# Patient Record
Sex: Male | Born: 1990
Health system: Southern US, Community
[De-identification: ages and names within clinical notes are randomized; demographics above are authoritative.]

## PROBLEM LIST (undated history)

## (undated) DIAGNOSIS — F429 Obsessive-compulsive disorder, unspecified: Secondary | ICD-10-CM

## (undated) DIAGNOSIS — F419 Anxiety disorder, unspecified: Secondary | ICD-10-CM

## (undated) DIAGNOSIS — F410 Panic disorder [episodic paroxysmal anxiety] without agoraphobia: Secondary | ICD-10-CM

## (undated) DIAGNOSIS — L309 Dermatitis, unspecified: Secondary | ICD-10-CM

## (undated) DIAGNOSIS — F909 Attention-deficit hyperactivity disorder, unspecified type: Secondary | ICD-10-CM

## (undated) DIAGNOSIS — J45909 Unspecified asthma, uncomplicated: Secondary | ICD-10-CM

## (undated) DIAGNOSIS — X838XXA Intentional self-harm by other specified means, initial encounter: Secondary | ICD-10-CM

## (undated) HISTORY — PX: NO PAST SURGERIES: SHX2092

## (undated) HISTORY — DX: Dermatitis, unspecified: L30.9

## (undated) HISTORY — DX: Intentional self-harm by other specified means, initial encounter: X83.8XXA

## (undated) HISTORY — DX: Attention-deficit hyperactivity disorder, unspecified type: F90.9

---
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
## 2012-03-22 ENCOUNTER — Emergency Department (HOSPITAL_BASED_OUTPATIENT_CLINIC_OR_DEPARTMENT_OTHER): Payer: BC Managed Care – PPO

## 2012-03-22 ENCOUNTER — Emergency Department (HOSPITAL_BASED_OUTPATIENT_CLINIC_OR_DEPARTMENT_OTHER)
Admission: EM | Admit: 2012-03-22 | Discharge: 2012-03-22 | Disposition: A | Discharge status: Home / Self Care | Payer: BC Managed Care – PPO | Attending: Emergency Medicine | Admitting: Emergency Medicine

## 2012-03-22 ENCOUNTER — Encounter (HOSPITAL_BASED_OUTPATIENT_CLINIC_OR_DEPARTMENT_OTHER): Payer: Self-pay | Admitting: Family Medicine

## 2012-03-22 DIAGNOSIS — J45909 Unspecified asthma, uncomplicated: Secondary | ICD-10-CM | POA: Insufficient documentation

## 2012-03-22 DIAGNOSIS — X500XXA Overexertion from strenuous movement or load, initial encounter: Secondary | ICD-10-CM | POA: Insufficient documentation

## 2012-03-22 DIAGNOSIS — Y9383 Activity, rough housing and horseplay: Secondary | ICD-10-CM | POA: Insufficient documentation

## 2012-03-22 DIAGNOSIS — S63501A Unspecified sprain of right wrist, initial encounter: Secondary | ICD-10-CM

## 2012-03-22 DIAGNOSIS — S63509A Unspecified sprain of unspecified wrist, initial encounter: Secondary | ICD-10-CM | POA: Insufficient documentation

## 2012-03-22 DIAGNOSIS — F17200 Nicotine dependence, unspecified, uncomplicated: Secondary | ICD-10-CM | POA: Insufficient documentation

## 2012-03-22 DIAGNOSIS — F429 Obsessive-compulsive disorder, unspecified: Secondary | ICD-10-CM | POA: Insufficient documentation

## 2012-03-22 HISTORY — DX: Obsessive-compulsive disorder, unspecified: F42.9

## 2012-03-22 HISTORY — DX: Unspecified asthma, uncomplicated: J45.909

## 2012-03-22 MED ORDER — IBUPROFEN 800 MG PO TABS
800.0000 mg | ORAL_TABLET | Freq: Once | ORAL | Status: AC
  Administered 2012-03-22: 800 mg via ORAL
  Filled 2012-03-22: qty 1

## 2012-03-22 NOTE — ED Provider Notes (Signed)
History     CSN: [id number]  Arrival date & time 03/22/12  1221   First MD Initiated Contact with Patient 03/22/12 1245      Chief Complaint  Patient presents with  . Wrist Pain    (Consider location/radiation/quality/duration/timing/severity/associated sxs/prior treatment) HPI Patient is a 21 year old male who presents today complaining of right wrist pain that is a 6/10 when it is moved or palpated. He reports only 2/10 pain at rest. Patient was joking around and roughhousing with friends last night when he injured his wrist. He is right-hand dominant. He reports that he did not have pain last night but that he noted the pain today while he was working lifting heavy boxes. Patient denies any other injuries. He took ibuprofen this morning at 5 AM and had some improvement. The pain is an aching sensation. There are no other associated or modifying factors. Past Medical History  Diagnosis Date  . Asthma   . OCD (obsessive compulsive disorder)     History reviewed. No pertinent past surgical history.  History reviewed. No pertinent family history.  History  Substance Use Topics  . Smoking status: Current Everyday Smoker  . Smokeless tobacco: Not on file  . Alcohol Use: Yes      Review of Systems  Constitutional: Negative.   HENT: Negative.   Eyes: Negative.   Respiratory: Negative.   Cardiovascular: Negative.   Gastrointestinal: Negative.   Genitourinary: Negative.   Musculoskeletal:       Right wrist pain  Skin: Negative.   Neurological: Negative.   Hematological: Negative.   Psychiatric/Behavioral: Negative.   All other systems reviewed and are negative.    Allergies  Review of patient's allergies indicates no known allergies.  Home Medications   Current Outpatient Rx  Name Route Sig Dispense Refill  . ALBUTEROL SULFATE HFA 108 (90 BASE) MCG/ACT IN AERS Inhalation Inhale 2 puffs into the lungs every 6 (six) hours as needed.    [name] FLUTICASONE-SALMETEROL  100-50 MCG/DOSE IN AEPB Inhalation Inhale 1 puff into the lungs every 12 (twelve) hours.    [name] MONTELUKAST SODIUM 10 MG PO TABS Oral Take 10 mg by mouth at bedtime.    . SERTRALINE HCL 25 MG PO TABS Oral Take 25 mg by mouth daily.       BP 127/78  Pulse 83  Temp 98 F (36.7 C) (Oral)  Resp 16  Ht 6\' 2"  (1.88 m)  Wt 160 lb (72.576 kg)  BMI 20.54 kg/m2  SpO2 99%  Physical Exam  Nursing note and vitals reviewed. GEN: Well-developed, well-nourished male in no distress HEENT: Atraumatic, normocephalic.  EYES: PERRLA BL, no scleral icterus. NECK: Trachea midline, no meningismus CV: regular rate and rhythm.  PULM: No respiratory distress.   Neuro: cranial nerves grossly 2-12 intact, no abnormalities of strength or sensation, A and O x 3 MSK: Patient moves all 4 extremities symmetrically. Mild tenderness to palpation over the dorsum of the right wrist. Patient has no snuffbox tenderness or deformity. He is neurovascularly intact distal to the injury with no deficits of strength. Otherwise within normal limits.  Skin: No rashes petechiae, purpura, or jaundice Psych: no abnormality of mood   ED Course  Procedures (including critical care time)  Labs Reviewed - No data to display Dg Wrist Complete Right  03/22/2012  *RADIOLOGY REPORT*  Clinical Data: Right wrist pain following an injury last night.  RIGHT WRIST - COMPLETE 3+ VIEW  Comparison: None.  Findings: Normal appearing bones and soft tissues  without fracture or dislocation.  IMPRESSION: Normal examination.  Original Report Authenticated By: Darrol Angel, M.D.     1. Sprain of wrist, right       MDM  Patient was evaluated by myself. Based on evaluation patient had plain film of the right wrist. This was negative. I had no concerns for infectious ideology and patient had no bony an etiology for his symptoms. I do think patient likely has right wrist sprain. He was given ibuprofen as well as an ice pack and wrist splint for  support. Patient can followup with Dr. Pearletha Forge if he continues to have difficulty with this. Patient was discharged in good condition.        Cyndra Numbers, MD 03/22/12 1425

## 2012-03-22 NOTE — ED Notes (Signed)
Pt c/o right wrist pain after "wrestling with friends" last night. Cms intact.

## 2012-03-22 NOTE — ED Notes (Signed)
Patient transported to X-ray 

## 2012-03-22 NOTE — Discharge Instructions (Signed)
Joint Sprain A sprain is a tear or stretch in the ligaments that hold a joint together. Severe sprains may need as long as 3-6 weeks of immobilization and/or exercises to heal completely. Sprained joints should be rested and protected. If not, they can become unstable and prone to re-injury. Proper treatment can reduce your pain, shorten the period of disability, and reduce the risk of repeated injuries. TREATMENT   Rest and elevate the injured joint to reduce pain and swelling.   Apply ice packs to the injury for 20-30 minutes every 2-3 hours for the next 2-3 days.   Keep the injury wrapped in a compression bandage or splint as long as the joint is painful or as instructed by your caregiver.   Do not use the injured joint until it is completely healed to prevent re-injury and chronic instability. Follow the instructions of your caregiver.   Long-term sprain management may require exercises and/or treatment by a physical therapist. Taping or special braces may help stabilize the joint until it is completely better.  SEEK MEDICAL CARE IF:   You develop increased pain or swelling of the joint.   You develop increasing redness and warmth of the joint.   You develop a fever.   It becomes stiff.   Your hand or foot gets cold or numb.  Document Released: 10/30/2004 Document Revised: 09/11/2011 Document Reviewed: 10/09/2008 [employer] Patient Information 2012 [employer]. 

## 2015-10-03 ENCOUNTER — Other Ambulatory Visit: Payer: Self-pay | Admitting: *Deleted

## 2015-10-03 DIAGNOSIS — J4520 Mild intermittent asthma, uncomplicated: Secondary | ICD-10-CM

## 2015-10-04 ENCOUNTER — Ambulatory Visit (INDEPENDENT_AMBULATORY_CARE_PROVIDER_SITE_OTHER): Payer: BLUE CROSS/BLUE SHIELD | Admitting: Internal Medicine

## 2015-10-04 DIAGNOSIS — J4520 Mild intermittent asthma, uncomplicated: Secondary | ICD-10-CM

## 2015-10-04 LAB — PULMONARY FUNCTION TEST
DL/VA % pred: 111 %
DL/VA: 5.43 ml/min/mmHg/L
DLCO unc % pred: 101 %
DLCO unc: 37.01 ml/min/mmHg
FEF 25-75 Post: 3.36 L/sec
FEF 25-75 Pre: 2.71 L/sec
FEF2575-%Change-Post: 23 %
FEF2575-%Pred-Post: 65 %
FEF2575-%Pred-Pre: 53 %
FEV1-%Change-Post: 5 %
FEV1-%Pred-Post: 79 %
FEV1-%Pred-Pre: 75 %
FEV1-Post: 3.99 L
FEV1-Pre: 3.78 L
FEV1FVC-%Change-Post: 5 %
FEV1FVC-%Pred-Pre: 83 %
FEV6-%Change-Post: 0 %
FEV6-%Pred-Post: 89 %
FEV6-%Pred-Pre: 89 %
FEV6-Post: 5.43 L
FEV6-Pre: 5.39 L
FEV6FVC-%Change-Post: 0 %
FEV6FVC-%Pred-Post: 100 %
FEV6FVC-%Pred-Pre: 100 %
FVC-%Change-Post: 0 %
FVC-%Pred-Post: 89 %
FVC-%Pred-Pre: 89 %
FVC-Post: 5.44 L
FVC-Pre: 5.45 L
Post FEV1/FVC ratio: 73 %
Post FEV6/FVC ratio: 100 %
Pre FEV1/FVC ratio: 69 %
Pre FEV6/FVC Ratio: 100 %
RV % pred: 129 %
RV: 2.13 L
TLC % pred: 99 %
TLC: 7.46 L

## 2015-10-04 NOTE — Progress Notes (Signed)
PFT performed today. 

## 2016-04-30 ENCOUNTER — Emergency Department (HOSPITAL_BASED_OUTPATIENT_CLINIC_OR_DEPARTMENT_OTHER): Payer: BLUE CROSS/BLUE SHIELD

## 2016-04-30 ENCOUNTER — Emergency Department (HOSPITAL_BASED_OUTPATIENT_CLINIC_OR_DEPARTMENT_OTHER)
Admission: EM | Admit: 2016-04-30 | Discharge: 2016-05-01 | Disposition: A | Discharge status: Home / Self Care | Payer: BLUE CROSS/BLUE SHIELD | Attending: Emergency Medicine | Admitting: Emergency Medicine

## 2016-04-30 ENCOUNTER — Encounter (HOSPITAL_BASED_OUTPATIENT_CLINIC_OR_DEPARTMENT_OTHER): Payer: Self-pay | Admitting: *Deleted

## 2016-04-30 DIAGNOSIS — Z87891 Personal history of nicotine dependence: Secondary | ICD-10-CM | POA: Diagnosis not present

## 2016-04-30 DIAGNOSIS — J45901 Unspecified asthma with (acute) exacerbation: Secondary | ICD-10-CM | POA: Insufficient documentation

## 2016-04-30 DIAGNOSIS — J45909 Unspecified asthma, uncomplicated: Secondary | ICD-10-CM | POA: Diagnosis present

## 2016-04-30 DIAGNOSIS — F419 Anxiety disorder, unspecified: Secondary | ICD-10-CM | POA: Diagnosis not present

## 2016-04-30 MED ORDER — PREDNISONE 50 MG PO TABS
50.0000 mg | ORAL_TABLET | Freq: Once | ORAL | Status: AC
  Administered 2016-04-30: 50 mg via ORAL
  Filled 2016-04-30: qty 1

## 2016-04-30 MED ORDER — IPRATROPIUM-ALBUTEROL 0.5-2.5 (3) MG/3ML IN SOLN
3.0000 mL | Freq: Four times a day (QID) | RESPIRATORY_TRACT | Status: DC
  Administered 2016-04-30: 3 mL via RESPIRATORY_TRACT
  Filled 2016-04-30: qty 3

## 2016-04-30 MED ORDER — ALBUTEROL SULFATE (2.5 MG/3ML) 0.083% IN NEBU
5.0000 mg | INHALATION_SOLUTION | RESPIRATORY_TRACT | Status: AC
  Administered 2016-04-30: 5 mg via RESPIRATORY_TRACT
  Filled 2016-04-30: qty 6

## 2016-04-30 MED ORDER — LORAZEPAM 1 MG PO TABS
1.0000 mg | ORAL_TABLET | Freq: Once | ORAL | Status: AC
  Administered 2016-04-30: 1 mg via ORAL
  Filled 2016-04-30: qty 1

## 2016-04-30 NOTE — ED Triage Notes (Signed)
Pt c/o "asthma attack" no relief with albuterol inhaler x 2 days

## 2016-04-30 NOTE — ED Provider Notes (Signed)
[facility] Note   CSN: [id number] Arrival date & time: 04/30/16  2233  First Provider Contact:  None    By signing my name below, I, Arianna Nassar, attest that this documentation has been prepared under the direction and in the presence of Chamara Dyck, PA-C.  Electronically Signed: Octavia Heir, ED Scribe. 04/30/16. 11:09 PM.    History   Chief Complaint Chief Complaint  Patient presents with  . Asthma    The history is provided by the patient. No language interpreter was used.   HPI Comments: Isaiah Stevens is a 25 y.o. male who has a PMHx of asthma and OCD presents to the Emergency Department complaining of a sudden onset, gradual worsening, moderate asthma attack that started two days ago but became worse about two hours ago. He reports associated wheezing, chest tightness, and allergy symptoms that include rhinorrhea. Pt says he was very active today and was outside most of the day. He exercised and ran without respiratory issue. Pt says the air conditioner in his house is broken and it is very humid. He believes this triggered his asthma attack earlier today. Pt has been using his inhaler to alleviate his symptoms with no relief. Pt states that he believes he is also experiencing a panic attack due to sensation of not being able to breath. Pt reports history of anxiety and panic. He was previously tried on SSRIs but states that he took himself off anxiety medicines because he was able to control his symptoms with exercise. He denies any other complaints today.   Past Medical History:  Diagnosis Date  . Asthma   . OCD (obsessive compulsive disorder)     Patient Active Problem List   Diagnosis Date Noted  . ADHD 09/06/2009  . ALLERGIC RHINITIS 09/06/2009  . ASTHMA 09/06/2009    History reviewed. No pertinent surgical history.     Home Medications    Prior to Admission medications   Medication Sig Start Date End Date Taking? Authorizing  Provider  albuterol (PROVENTIL HFA;VENTOLIN HFA) 108 (90 BASE) MCG/ACT inhaler Inhale 2 puffs into the lungs every 6 (six) hours as needed.    Historical Provider, MD  Fluticasone-Salmeterol (ADVAIR) 100-50 MCG/DOSE AEPB Inhale 1 puff into the lungs every 12 (twelve) hours.    Historical Provider, MD  montelukast (SINGULAIR) 10 MG tablet Take 10 mg by mouth at bedtime.    Historical Provider, MD  predniSONE (DELTASONE) 50 MG tablet Take one tablet once a day for 5 days 05/01/16   Rolm Gala Trenia Tennyson, PA-C  sertraline (ZOLOFT) 25 MG tablet Take 25 mg by mouth daily.     Historical Provider, MD    Family History History reviewed. No pertinent family history.  Social History Social History  Substance Use Topics  . Smoking status: Former Games developer  . Smokeless tobacco: Not on file  . Alcohol use Yes     Allergies   Review of patient's allergies indicates no known allergies.   Review of Systems Review of Systems  HENT: Positive for rhinorrhea.   Respiratory: Positive for wheezing.   Cardiovascular: Positive for chest pain (chest tightness).  All other systems reviewed and are negative.    Physical Exam Updated Vital Signs BP 129/84 (BP Location: Left Arm)   Pulse 84   Temp 98.1 F (36.7 C) (Oral)   Resp 16   Ht  (1.854 m)   Wt 86.2 kg   SpO2 100%   BMI 25.07 kg/m   Physical Exam  Constitutional: He is oriented to person, place, and time. He appears well-developed and well-nourished.  Pt extremely anxious  HENT:  Head: Normocephalic.  Mouth/Throat: Oropharynx is clear and moist.  Eyes: Conjunctivae and EOM are normal. Right eye exhibits no discharge. Left eye exhibits no discharge.  Neck: Normal range of motion. Neck supple.  Cardiovascular: Normal rate, regular rhythm and normal heart sounds.   Pulmonary/Chest: Effort normal. No respiratory distress. He has wheezes. He has no rales.  Evaluated patient after initial breathing treatment. Pt taking deep, slow breaths. No  increased work of breathing or respiratory distress. No accessory muscle use. Rare wheeze heard in lower lung fields. Good air movement throughout  Abdominal: Soft. He exhibits no distension. There is no tenderness.  Musculoskeletal: Normal range of motion.  Lymphadenopathy:    He has no cervical adenopathy.  Neurological: He is alert and oriented to person, place, and time.  Psychiatric: His speech is normal and behavior is normal. Thought content normal. His mood appears anxious.  Nursing note and vitals reviewed.    ED Treatments / Results  DIAGNOSTIC STUDIES: Oxygen Saturation is 100% on RA, normal by my interpretation.  COORDINATION OF CARE:  11:07 PM Will order CXR, albuterol treatment, and prednisone. Discussed treatment plan with pt at bedside and pt agreed to plan.  Labs (all labs ordered are listed, but only abnormal results are displayed) Labs Reviewed - No data to display  EKG  EKG Interpretation None       Radiology Dg Chest 2 View  Result Date: 04/30/2016 CLINICAL DATA:  Asthmatic attack. EXAM: CHEST  2 VIEW COMPARISON:  None. FINDINGS: Normal cardiac silhouette and mediastinal contours. No focal parenchymal opacities. No pleural effusion or pneumothorax. No evidence of edema. No acute osseus abnormalities. IMPRESSION: No acute cardiopulmonary disease. Electronically Signed   By: Simonne Come M.D.   On: 04/30/2016 23:35   Procedures Procedures (including critical care time)  Medications Ordered in ED Medications  albuterol (PROVENTIL) (2.5 MG/3ML) 0.083% nebulizer solution 5 mg (5 mg Nebulization Given 04/30/16 2244)  predniSONE (DELTASONE) tablet 50 mg (50 mg Oral Given 04/30/16 2334)  LORazepam (ATIVAN) tablet 1 mg (1 mg Oral Given 04/30/16 2334)     Initial Impression / Assessment and Plan / ED Course  I have reviewed the triage vital signs and the nursing notes.  Pertinent labs & imaging results that were available during my care of the patient were  reviewed by me and considered in my medical decision making (see chart for details).  Clinical Course   25 year old male presenting with asthma exacerbation. Oxygen saturation 100% on RA. Pt received breathing treatment prior to my evaluation. Rare wheeze heard in lower lung fields. Good air movement throughout. Second duoneb and prednisone ordered. Pt is extremely anxious and states he is having a panic attack. Reassured patient by discussing his 100% oxygen saturation and redirecting conversation. 1 mg ativan given with improvement in anxiety. Lung exam before discharge with good air movement throughout and no wheezing. Pt's symptoms consistent with an asthma exacerbation complicated by anxiety. Pt ambulates with O2 maintained above 98%. Pt is in no respiratory distress prior to discharge. Will discharge with prednisone burst and close PCP follow up. Discussed reasons to return immediately to ER and pt states understanding. Pt stable for discharge.    Final Clinical Impressions(s) / ED Diagnoses   Final diagnoses:  Asthma exacerbation  Anxiety   I personally performed the services described in this documentation, which was scribed in  my presence. The recorded information has been reviewed and is accurate.  New Prescriptions Discharge Medication List as of 05/01/2016 12:30 AM    START taking these medications   Details  predniSONE (DELTASONE) 50 MG tablet Take one tablet once a day for 5 days, Print         Alveta Heimlich, PA-C 05/01/16 1121    Derwood Kaplan, MD 05/02/16 [phone]

## 2016-05-01 MED ORDER — PREDNISONE 50 MG PO TABS: ORAL_TABLET | ORAL | 0 refills | Status: DC

## 2016-05-01 NOTE — ED Notes (Signed)
Pt ambulated around dept on room air. SATS were 98-100%, HR 87. No distress noted

## 2016-05-01 NOTE — ED Notes (Signed)
Pt verbalizes understanding of d/c instructions and denies any further needs at this time. 

## 2016-08-04 ENCOUNTER — Emergency Department (HOSPITAL_BASED_OUTPATIENT_CLINIC_OR_DEPARTMENT_OTHER)
Admission: EM | Admit: 2016-08-04 | Discharge: 2016-08-04 | Disposition: A | Discharge status: Home / Self Care | Payer: BLUE CROSS/BLUE SHIELD | Attending: Emergency Medicine | Admitting: Emergency Medicine

## 2016-08-04 ENCOUNTER — Emergency Department (HOSPITAL_BASED_OUTPATIENT_CLINIC_OR_DEPARTMENT_OTHER): Payer: BLUE CROSS/BLUE SHIELD

## 2016-08-04 ENCOUNTER — Encounter (HOSPITAL_BASED_OUTPATIENT_CLINIC_OR_DEPARTMENT_OTHER): Payer: Self-pay | Admitting: Emergency Medicine

## 2016-08-04 DIAGNOSIS — R0602 Shortness of breath: Secondary | ICD-10-CM | POA: Insufficient documentation

## 2016-08-04 DIAGNOSIS — F909 Attention-deficit hyperactivity disorder, unspecified type: Secondary | ICD-10-CM | POA: Insufficient documentation

## 2016-08-04 DIAGNOSIS — R0789 Other chest pain: Secondary | ICD-10-CM | POA: Insufficient documentation

## 2016-08-04 DIAGNOSIS — F419 Anxiety disorder, unspecified: Secondary | ICD-10-CM | POA: Insufficient documentation

## 2016-08-04 DIAGNOSIS — J45909 Unspecified asthma, uncomplicated: Secondary | ICD-10-CM | POA: Insufficient documentation

## 2016-08-04 DIAGNOSIS — F17210 Nicotine dependence, cigarettes, uncomplicated: Secondary | ICD-10-CM | POA: Diagnosis not present

## 2016-08-04 HISTORY — DX: Anxiety disorder, unspecified: F41.9

## 2016-08-04 MED ORDER — IBUPROFEN 400 MG PO TABS
600.0000 mg | ORAL_TABLET | Freq: Once | ORAL | Status: AC
  Administered 2016-08-04: 600 mg via ORAL
  Filled 2016-08-04: qty 1

## 2016-08-04 MED ORDER — IPRATROPIUM-ALBUTEROL 0.5-2.5 (3) MG/3ML IN SOLN
3.0000 mL | Freq: Once | RESPIRATORY_TRACT | Status: AC
  Administered 2016-08-04: 3 mL via RESPIRATORY_TRACT
  Filled 2016-08-04: qty 3

## 2016-08-04 MED ORDER — LORAZEPAM 1 MG PO TABS: 1.0000 mg | ORAL_TABLET | Freq: Three times a day (TID) | ORAL | 0 refills | Status: DC | PRN

## 2016-08-04 MED ORDER — LORAZEPAM 1 MG PO TABS
1.0000 mg | ORAL_TABLET | Freq: Once | ORAL | Status: AC
Start: 2016-08-04 — End: 2016-08-04
  Administered 2016-08-04: 1 mg via ORAL
  Filled 2016-08-04: qty 1

## 2016-08-04 NOTE — Discharge Instructions (Signed)
Please read all patient instructions and resource material Follow up with primary care doctor as soon as possible to determine best anxiety management See resource guide for individual therapy/counseling Please return to ED if shortness of breath, anxiety, chest pain worsen Do not drive or use heavy machinery after taking Ativan Only take Ativan as prescribed

## 2016-08-04 NOTE — ED Provider Notes (Signed)
[facility] Provider Note   CSN: [id number] Arrival date & time: 08/04/16  [id number]     History   Chief Complaint Chief Complaint  Patient presents with  . Shortness of Breath    HPI Isaiah Stevens is a 25 y.o. male with pmh of anxiety/anxiety attacks, asthma and ocd here for 5/10 localized, left sided chest discomfort and tightness that started 2 days ago associated with increased anxiety and stress.  Pt first noticed the chest discomfort Saturday night when he was at a party, he had been drinking alcohol and using an e-cigarette that night.  Chest discomfort has been constant throughout the day, is not exacerbated by exertion or positional changes.  He woke up this morning with the chest discomfort and was able to work out without issues.  Pt has tried naproxen for chest discomfort which provided some relief.  Pt became teary eyed during encounter and admits to being "the most stressed out in his entire life" with school, work and recent relationship issues.  Recent break up 2 weeks ago with girl who he wanted to marry.  He has began working out daily 2hr/day and eating only vegetables and nuts since break up. Pt had SIs at time of break up but denies SIs or thoughts of harming self or others today.  Pt has had two previous anxiety attacks (last episode 04/2016, see chart) and he states chest discomfort and anxiety feel similar to two previous anxiety attacks.  He has tried Zoloft in the past for anxiety but states "it did not work for me". Pt has seen therapist for anxiety and states it was helpful.   Pt states he has noticed cough in the mornings with clear/white sputum with speckles of blood for the past 2 weeks.  Pt admits that cough/sputum with speckles of blood occur reliably every morning after he smokes marijuana.  He denies recent travel abroad, fevers, rashes, recent weight loss.  He mentions some coworkers have been getting sick but denies close contacts with similar  symptoms.  No fever, N/V/D/C, recent surgeries or immobilization, no previous h/o PE.  No family history of sudden cardiac death at a young age.   HPI  Past Medical History:  Diagnosis Date  . Anxiety   . Asthma   . OCD (obsessive compulsive disorder)     Patient Active Problem List   Diagnosis Date Noted  . ADHD 09/06/2009  . ALLERGIC RHINITIS 09/06/2009  . ASTHMA 09/06/2009    History reviewed. No pertinent surgical history.     Home Medications    Prior to Admission medications   Medication Sig Start Date End Date Taking? Authorizing Provider  albuterol (PROVENTIL HFA;VENTOLIN HFA) 108 (90 BASE) MCG/ACT inhaler Inhale 2 puffs into the lungs every 6 (six) hours as needed.    Historical Provider, MD  Fluticasone-Salmeterol (ADVAIR) 100-50 MCG/DOSE AEPB Inhale 1 puff into the lungs every 12 (twelve) hours.    Historical Provider, MD  LORazepam (ATIVAN) 1 MG tablet Take 1 tablet (1 mg total) by mouth 3 (three) times daily as needed for anxiety. 08/04/16   Liberty Handylaudia J Copeland Lapier, PA-C  montelukast (SINGULAIR) 10 MG tablet Take 10 mg by mouth at bedtime.    Historical Provider, MD  predniSONE (DELTASONE) 50 MG tablet Take one tablet once a day for 5 days 05/01/16   Rolm GalaStevi Barrett, PA-C  sertraline (ZOLOFT) 25 MG tablet Take 25 mg by mouth daily.     Historical Provider, MD    Family History History  reviewed. No pertinent family history.  Social History Social History  Substance Use Topics  . Smoking status: Current Some Day Smoker    Types: E-cigarettes  . Smokeless tobacco: Never Used  . Alcohol use Yes     Comment: occasional      Allergies   Review of patient's allergies indicates no known allergies.   Review of Systems Review of Systems  Constitutional: Positive for activity change (daily exercise 2h a day for past 2 weeks). Negative for appetite change, chills and fever.  HENT: Negative for congestion and sneezing.   Eyes: Negative.   Respiratory: Positive for  cough (cough produces white/clear sputum with speckles of blood every morning after smoking marijuana) and chest tightness (left sided ). Negative for wheezing.   Cardiovascular: Negative for palpitations and leg swelling. Chest pain:         Tight-like, constant, left sided chest discomfort similar in character to two previous anxiety attacks  Gastrointestinal: Negative for abdominal distention, abdominal pain, constipation, diarrhea, nausea and vomiting.  Endocrine: Negative.   Genitourinary: Negative.   Musculoskeletal: Negative.   Skin: Negative.   Neurological: Negative for headaches.  Hematological: Negative.   Psychiatric/Behavioral: Negative for self-injury and suicidal ideas. The patient is nervous/anxious (increased anxiety/stress since break up 2 weeks ago).      Physical Exam Updated Vital Signs BP 133/73   Pulse 97   Temp 98.4 F (36.9 C) (Oral)   Resp 18   Ht 6\' 2"  (1.88 m)   Wt 81.6 kg   SpO2 100%   BMI 23.11 kg/m   Physical Exam  Constitutional: He is oriented to person, place, and time. He appears well-developed and well-nourished.  Pt became teary eyed during encounter.  HENT:  Head: Normocephalic and atraumatic.  Eyes: Pupils are equal, round, and reactive to light.  Neck: Neck supple.  Cardiovascular: Normal rate, regular rhythm and normal heart sounds.   Pulmonary/Chest: Effort normal.  Musculoskeletal:  Anterior chest wall was nontender to light/deep palpation. Pt reported increased anterior chest wall pain with elbow extension against resistance.   Neurological: He is alert and oriented to person, place, and time.  Skin: Skin is warm and dry. He is not diaphoretic.  Psychiatric: His behavior is normal. Judgment and thought content normal.  Pt was pleasant, open and became teary eyed during encounter.  Seemed appropriately anxious due to recent life/work stressors.  Denies current SIs or thoughts of hurting self or others.     ED Treatments /  Results  Labs (all labs ordered are listed, but only abnormal results are displayed) Labs Reviewed - No data to display  EKG  EKG Interpretation  Date/Time:  Monday August 04 2016 10:25:22 EDT Ventricular Rate:  63 PR Interval:    QRS Duration: 100 QT Interval:  365 QTC Calculation: 374 R Axis:   79 Text Interpretation:  Sinus rhythm Baseline wander in lead(s) II III aVF agree. normal. no old comparison Confirmed by Donnald GarrePfeiffer, MD, Lebron ConnersMarcy [phone]([id number]) on 08/04/2016 10:35:15 AM       Radiology Dg Chest 2 View  Result Date: 08/04/2016 CLINICAL DATA:  Cough. EXAM: CHEST  2 VIEW COMPARISON:  04/30/2016. FINDINGS: Mediastinum and hilar structures normal. Lungs are clear. No pleural effusion or pneumothorax. IMPRESSION: No acute cardiopulmonary disease. Electronically Signed   By: Maisie Fushomas  Register   On: 08/04/2016 10:27    Procedures Procedures (including critical care time)  Medications Ordered in ED Medications  LORazepam (ATIVAN) tablet 1 mg (1 mg Oral Given 08/04/16  1022)  ipratropium-albuterol (DUONEB) 0.5-2.5 (3) MG/3ML nebulizer solution 3 mL (3 mLs Nebulization Given 08/04/16 1114)  ibuprofen (ADVIL,MOTRIN) tablet 600 mg (600 mg Oral Given 08/04/16 1118)     Initial Impression / Assessment and Plan / ED Course  I have reviewed the triage vital signs and the nursing notes.  Pertinent labs & imaging results that were available during my care of the patient were reviewed by me and considered in my medical decision making (see chart for details).  Clinical Course   25 y.o male with h/o anxiety, ocd and asthma presents with L sided chest tightness since Saturday associated with increased anxiety and stress since ending a serious romantic relationship 2 weeks ago.  Most recent anxiety attack was 04/2016.  On exam pt is pleasant, but anxious and takes deep breaths in between sentences.  Cardiac and pulmonary exam and benign, O2 sats wnl.  Ddx includes ACS, Tb, PE, MSK strain,  bronchitis, pleuritis, costochondritis or anxiety.  Pt has no personal cardiac history, exercises regularly and eats a healthy diet, thus he is low risk for ACS.  EKG was also negative today.  Although pt endorses cough a/w with sputum speckled with blood, this occurs reliably every morning after he smokes marijuana and is not likely PE induced hemoptysis.  Given that EKG, CXR negative and pt is afebrile and has expressed significant emotional stressors this chest tightness is most likely related to h/o anxiety.  Most importantly, pt explains that his symptoms are similar to two previous anxiety attacks/episodes.  Pt's anxiety and chest discomfort improved after ativan and duoneb.  Pt denied SIs or thoughts of harming self/others prior to discharge. Pt discharged with a short course of ativan and behavioral health/counseling resources.  Pt had no further questions or concerns prior to discharge, pt was nstructed to f/u with PCP and establish care with a therapist as soon as possible for long term anxiety management.     Final Clinical Impressions(s) / ED Diagnoses   Final diagnoses:  Atypical chest pain  Anxiety    New Prescriptions Discharge Medication List as of 08/04/2016 12:17 PM    START taking these medications   Details  LORazepam (ATIVAN) 1 MG tablet Take 1 tablet (1 mg total) by mouth 3 (three) times daily as needed for anxiety., Starting Mon 08/04/2016, Print         Liberty Handy, PA-C 08/04/16 2147    Arby Barrette, MD 08/05/16 1014

## 2016-08-04 NOTE — ED Notes (Signed)
Patient transported to X-ray 

## 2016-08-04 NOTE — ED Triage Notes (Signed)
Pt c/o SHOB since Saturday after smoking e-cig this weekend; reports cough x 1-2 wks w/ blood-tinged mucous.

## 2016-08-04 NOTE — ED Notes (Signed)
Pt sts feels somewhat less anxious now, but still feels pressure in LT chest.

## 2016-09-10 ENCOUNTER — Encounter: Payer: Self-pay | Admitting: Pulmonary Disease

## 2016-09-10 ENCOUNTER — Ambulatory Visit (INDEPENDENT_AMBULATORY_CARE_PROVIDER_SITE_OTHER): Payer: BLUE CROSS/BLUE SHIELD | Admitting: Pulmonary Disease

## 2016-09-10 VITALS — BP 110/70 | HR 68 | Ht 74.0 in | Wt 179.6 lb

## 2016-09-10 DIAGNOSIS — R042 Hemoptysis: Secondary | ICD-10-CM

## 2016-09-10 DIAGNOSIS — J479 Bronchiectasis, uncomplicated: Secondary | ICD-10-CM

## 2016-09-10 NOTE — Patient Instructions (Signed)
We will arrange for a high-resolution CT scan of your chest and then call you with the results If the problem recurs let us know Keep taking your Advair as you're doing Stop smoking We will see you back if you have recurrence of the hemoptysis

## 2016-09-10 NOTE — Progress Notes (Signed)
Subjective:    Patient ID: Isaiah Stevens, male    DOB: 08-May-1991, 25 y.o.   MRN: [id number]  HPI Chief Complaint  Patient presents with  . Pulm Consult    x2-3 months. Coughing in the morning and noticed blood in phlegm. Stopped for a few weeks and then returned 3 days ago. Chest pain in middle of chest. Has had imaging done and everything came back normal. Wants an 2nd opinion.     A few months back Mr. Allen said he coughed up some blood.  He has been worried about this since then.  The problem lasted for about two weeks an only occurred in the mornings.  He said it would not occur later in the day afterwards.  He previously smoked some, about 3/4 pack per day.  He smoked from age 50 to age 16 or 43 about 1 pack per day.  He has also smoked marijuana.  He vaped for a while.  He stopped vaping around age 42 but continued smoking marijuana.  He started smoking cigarettes again briefly in 2017 when he would go out to the bars and smoke some.  The next mornings he would cough up mucus with blood specs in it.    He has coughed up mucus for two.   He has had asthma "his whole life".  He remembers having it for his whole life.  He has been to the hospital for it many times over his life (mostly ER).  He was never mechanically ventilated.  Most of his visits involved nebulized treatments.  He has been on Advair for 5-6 years.    He tries to live a healthy lifestyle otherwise in terms of exercise.  He does a lot of cardio work, like running.  Yesterday he was breathing better when he went for a run.  Typically he will clear out some mucus after starting running.    In the last year he has had some problems with chest pain and tightness, he went ot the ER for this in October.  He had some indigestion and acid reflux associated with the chest pain a few months ago.  He has not had the pain in his chest for the last few months.    He uses his albuterol every other day for dyspnea and chest  tightness.  This is actually less than his last visit.    No family history of lung problems, mother is adopted.      Past Medical History:  Diagnosis Date  . Anxiety   . Asthma   . OCD (obsessive compulsive disorder)      No family history on file.   Social History   Social History  . Marital status: Single    Spouse name: N/A  . Number of children: N/A  . Years of education: N/A   Occupational History  . Not on file.   Social History Main Topics  . Smoking status: Current Some Day Smoker    Types: E-cigarettes  . Smokeless tobacco: Never Used     Comment: Social smoker, does NOT smoke every day. Maybe 3/4 a pack a month.   . Alcohol use Yes     Comment: occasional   . Drug use: No  . Sexual activity: Not on file   Other Topics Concern  . Not on file   Social History Narrative  . No narrative on file     No Known Allergies   Outpatient Medications Prior to Visit  Medication Sig Dispense Refill  . albuterol (PROVENTIL HFA;VENTOLIN HFA) 108 (90 BASE) MCG/ACT inhaler Inhale 2 puffs into the lungs every 6 (six) hours as needed.    . Fluticasone-Salmeterol (ADVAIR) 100-50 MCG/DOSE AEPB Inhale 1 puff into the lungs every 12 (twelve) hours.    [name]. LORazepam (ATIVAN) 1 MG tablet Take 1 tablet (1 mg total) by mouth 3 (three) times daily as needed for anxiety. 10 tablet 0  . montelukast (SINGULAIR) 10 MG tablet Take 10 mg by mouth at bedtime.    . predniSONE (DELTASONE) 50 MG tablet Take one tablet once a day for 5 days 5 tablet 0  . sertraline (ZOLOFT) 25 MG tablet Take 25 mg by mouth daily.      No facility-administered medications prior to visit.       Review of Systems  Constitutional: Negative for fever and unexpected weight change.  HENT: Positive for congestion. Negative for dental problem, ear pain, nosebleeds, postnasal drip, rhinorrhea, sinus pressure, sneezing, sore throat and trouble swallowing.   Eyes: Negative for redness and itching.  Respiratory:  Positive for cough, chest tightness and shortness of breath. Negative for wheezing.   Cardiovascular: Positive for chest pain. Negative for palpitations and leg swelling.  Gastrointestinal: Negative for nausea and vomiting.  Genitourinary: Negative for dysuria.  Musculoskeletal: Negative for joint swelling.  Skin: Negative for rash.  Neurological: Negative for headaches.  Hematological: Does not bruise/bleed easily.  Psychiatric/Behavioral: Negative for dysphoric mood. The patient is nervous/anxious.        Objective:   Physical Exam Vitals:   09/10/16 1016  BP: 110/70  Pulse: 68  SpO2: 97%  Weight: 179 lb 9.6 oz (81.5 kg)  Height: 6\' 2"  (1.88 m)  RA  Gen: well appearing, no acute distress HENT: NCAT, OP clear, neck supple without masses Eyes: PERRL, EOMi Lymph: no cervical lymphadenopathy PULM: CTA B CV: RRR, no mgr, no JVD GI: BS+, soft, nontender, no hsm Derm: no rash or skin breakdown MSK: normal bulk and tone Neuro: A&Ox4, CN II-XII intact, strength 5/5 in all 4 extremities Psyche: normal mood and affect   December 2016 lung function testing showed a prebronchodilator FEV1 to FVC ratio of 69%, FEV1 was 3.78 L 75% predicted, there was a significant improvement in his FEF 25-75 after use of a bronchodilator from 53% predicted to 65% predicted. There is also a trend towards hyperinflation and that his residual volume was 129% predicted with a normal total lung capacity at 7.46 L 99% predicted, DLCO normal 37.01 101% predicted  Records from primary care physician reviewed where he was seen post ER visit. Noted to have anxiety and asthma  October 2017 chest x-ray images personally reviewed showing normal pulmonary parenchyma and normal cardiac silhouette.    Assessment & Plan:  Hemoptysis This problem has resolved. I explained to him today that the most likely cause of the hemoptysis was bronchitis exacerbated by recurrent tobacco use. While bronchitis is the most common  cause of hemoptysis which is self-limited, this is a diagnosis of exclusion. I believe that evaluating this problem further with a high-resolution CT scan of the chest is appropriate considering his lifelong airway symptoms. I believe that his airways disease is just asthma, but considering the hemoptysis it's reasonable to assess for bronchiectasis.  Plan: High-resolution CT scan of the chest Stop smoking Follow-up if the problem recurs  ASTHMA Based on his symptoms it sounds as if he has moderate persistent asthma. His lung function testing from December 2016 supported this  with significant FEF 2575 obstruction which improved with bronchodilator, as well as a trend towards air-trapping. He also had moderate airflow obstruction from a standard assessment standpoint in that his FEV1 was 75% predicted with a ratio of 69%.  I believe that his daily mucus production is related to using inhaled tobacco, vaping, and marijuana. I advised him today to stop all of these. However, it should be noted that there is very little evidence in the scientific literature to suggest that use of marijuana adversely affects airways disease.  Plan: Continue Advair He may change to generic fluticasone and salmeterol in January based on his insurance coverage Continue follow-up with allergy Follow-up with me on an as-needed basis Stop smoking    Current Outpatient Prescriptions:  .  albuterol (PROVENTIL HFA;VENTOLIN HFA) 108 (90 BASE) MCG/ACT inhaler, Inhale 2 puffs into the lungs every 6 (six) hours as needed., Disp: , Rfl:  .  amphetamine-dextroamphetamine (ADDERALL XR) 15 MG 24 hr capsule, Take 15 mg by mouth every morning., Disp: , Rfl:  .  Fluticasone-Salmeterol (ADVAIR) 100-50 MCG/DOSE AEPB, Inhale 1 puff into the lungs every 12 (twelve) hours., Disp: , Rfl:  .  LORazepam (ATIVAN) 1 MG tablet, Take 1 tablet (1 mg total) by mouth 3 (three) times daily as needed for anxiety., Disp: 10 tablet, Rfl: 0 .   montelukast (SINGULAIR) 10 MG tablet, Take 10 mg by mouth at bedtime., Disp: , Rfl:

## 2016-09-10 NOTE — Assessment & Plan Note (Signed)
This problem has resolved. I explained to him today that the most likely cause of the hemoptysis was bronchitis exacerbated by recurrent tobacco use. While bronchitis is the most common cause of hemoptysis which is self-limited, this is a diagnosis of exclusion. I believe that evaluating this problem further with a high-resolution CT scan of the chest is appropriate considering his lifelong airway symptoms. I believe that his airways disease is just asthma, but considering the hemoptysis it's reasonable to assess for bronchiectasis.  Plan: High-resolution CT scan of the chest Stop smoking Follow-up if the problem recurs

## 2016-09-10 NOTE — Assessment & Plan Note (Signed)
Based on his symptoms it sounds as if he has moderate persistent asthma. His lung function testing from December 2016 supported this with significant FEF 2575 obstruction which improved with bronchodilator, as well as a trend towards air-trapping. He also had moderate airflow obstruction from a standard assessment standpoint in that his FEV1 was 75% predicted with a ratio of 69%.  I believe that his daily mucus production is related to using inhaled tobacco, vaping, and marijuana. I advised him today to stop all of these. However, it should be noted that there is very little evidence in the scientific literature to suggest that use of marijuana adversely affects airways disease.  Plan: Continue Advair He may change to generic fluticasone and salmeterol in January based on his insurance coverage Continue follow-up with allergy Follow-up with me on an as-needed basis Stop smoking

## 2016-09-23 ENCOUNTER — Ambulatory Visit (INDEPENDENT_AMBULATORY_CARE_PROVIDER_SITE_OTHER)
Admission: RE | Admit: 2016-09-23 | Discharge: 2016-09-23 | Disposition: A | Discharge status: Home / Self Care | Payer: BLUE CROSS/BLUE SHIELD | Source: Ambulatory Visit | Attending: Pulmonary Disease | Admitting: Pulmonary Disease

## 2016-09-23 DIAGNOSIS — J479 Bronchiectasis, uncomplicated: Secondary | ICD-10-CM

## 2017-03-13 ENCOUNTER — Emergency Department (HOSPITAL_BASED_OUTPATIENT_CLINIC_OR_DEPARTMENT_OTHER)
Admission: EM | Admit: 2017-03-13 | Discharge: 2017-03-13 | Disposition: A | Discharge status: Home / Self Care | Payer: BLUE CROSS/BLUE SHIELD | Attending: Emergency Medicine | Admitting: Emergency Medicine

## 2017-03-13 ENCOUNTER — Encounter (HOSPITAL_BASED_OUTPATIENT_CLINIC_OR_DEPARTMENT_OTHER): Payer: Self-pay

## 2017-03-13 ENCOUNTER — Emergency Department (HOSPITAL_BASED_OUTPATIENT_CLINIC_OR_DEPARTMENT_OTHER): Payer: BLUE CROSS/BLUE SHIELD

## 2017-03-13 DIAGNOSIS — F17290 Nicotine dependence, other tobacco product, uncomplicated: Secondary | ICD-10-CM | POA: Diagnosis not present

## 2017-03-13 DIAGNOSIS — J4541 Moderate persistent asthma with (acute) exacerbation: Secondary | ICD-10-CM | POA: Insufficient documentation

## 2017-03-13 DIAGNOSIS — Z79899 Other long term (current) drug therapy: Secondary | ICD-10-CM | POA: Diagnosis not present

## 2017-03-13 DIAGNOSIS — J45909 Unspecified asthma, uncomplicated: Secondary | ICD-10-CM | POA: Diagnosis present

## 2017-03-13 MED ORDER — PREDNISONE 50 MG PO TABS
60.0000 mg | ORAL_TABLET | Freq: Once | ORAL | Status: AC
  Administered 2017-03-13: 13:00:00 60 mg via ORAL
  Filled 2017-03-13: qty 1

## 2017-03-13 MED ORDER — ALBUTEROL SULFATE (2.5 MG/3ML) 0.083% IN NEBU
5.0000 mg | INHALATION_SOLUTION | Freq: Once | RESPIRATORY_TRACT | Status: AC
  Administered 2017-03-13: 5 mg via RESPIRATORY_TRACT
  Filled 2017-03-13: qty 6

## 2017-03-13 MED ORDER — IBUPROFEN 400 MG PO TABS
600.0000 mg | ORAL_TABLET | Freq: Once | ORAL | Status: AC
  Administered 2017-03-13: 600 mg via ORAL
  Filled 2017-03-13: qty 1

## 2017-03-13 MED ORDER — PREDNISONE 20 MG PO TABS: 40.0000 mg | ORAL_TABLET | Freq: Every day | ORAL | 0 refills | Status: AC

## 2017-03-13 NOTE — ED Triage Notes (Signed)
C/o "asthma"-right side chest tightness that feels like asthma exacerbation-no relief with inhaler-NAD-steady gait

## 2017-03-13 NOTE — ED Notes (Signed)
Pt on monitor 

## 2017-03-13 NOTE — ED Provider Notes (Signed)
[facility] Provider Note   CSN: [id number] Arrival date & time: 03/13/17  1232     History   Chief Complaint Chief Complaint  Patient presents with  . Asthma    HPI Isaiah Stevens is a 26 y.o. male.  HPI Patient presents to the emergency department complaining of tightness the right side of his chest which he reports are his symptoms when he developed an asthma exacerbation.  He had no improvement with his albuterol inhaler.  Mild shortness of breath.  No fevers or chills.  Mild productive cough.  No history DVT or pulmonary embolism.  He tried his inhaler at home without improvement in his symptoms.  Symptoms are mild in severity   Past Medical History:  Diagnosis Date  . Anxiety   . Asthma   . OCD (obsessive compulsive disorder)     Patient Active Problem List   Diagnosis Date Noted  . Hemoptysis 09/10/2016  . ADHD 09/06/2009  . ALLERGIC RHINITIS 09/06/2009  . ASTHMA 09/06/2009    History reviewed. No pertinent surgical history.     Home Medications    Prior to Admission medications   Medication Sig Start Date End Date Taking? Authorizing Provider  albuterol (PROVENTIL HFA;VENTOLIN HFA) 108 (90 BASE) MCG/ACT inhaler Inhale 2 puffs into the lungs every 6 (six) hours as needed.    [provider]  amphetamine-dextroamphetamine (ADDERALL XR) 15 MG 24 hr capsule Take 15 mg by mouth every morning.    [provider]  Fluticasone-Salmeterol (ADVAIR) 100-50 MCG/DOSE AEPB Inhale 1 puff into the lungs every 12 (twelve) hours.    [provider]  LORazepam (ATIVAN) 1 MG tablet Take 1 tablet (1 mg total) by mouth 3 (three) times daily as needed for anxiety. 08/04/16   Liberty Handy, PA-C  montelukast (SINGULAIR) 10 MG tablet Take 10 mg by mouth at bedtime.    [provider]  predniSONE (DELTASONE) 20 MG tablet Take 2 tablets (40 mg total) by mouth daily. 03/13/17 03/16/17  Azalia Bilis, MD    Family History No family  history on file.  Social History Social History  Substance Use Topics  . Smoking status: Current Some Day Smoker    Types: E-cigarettes  . Smokeless tobacco: Never Used     Comment: Social smoker, does NOT smoke every day. Maybe 3/4 a pack a month.   . Alcohol use Yes     Comment: occasional      Allergies   Patient has no known allergies.   Review of Systems Review of Systems  All other systems reviewed and are negative.    Physical Exam Updated Vital Signs SpO2 100%   Physical Exam  Constitutional: He is oriented to person, place, and time. He appears well-developed and well-nourished.  HENT:  Head: Normocephalic and atraumatic.  Eyes: EOM are normal.  Neck: Normal range of motion.  Cardiovascular: Normal rate and regular rhythm.   Pulmonary/Chest: Effort normal. He has wheezes.  Abdominal: Soft. He exhibits no distension. There is no tenderness.  Musculoskeletal: Normal range of motion.  Neurological: He is alert and oriented to person, place, and time.  Skin: Skin is warm and dry.  Psychiatric: He has a normal mood and affect. Judgment normal.  Nursing note and vitals reviewed.    ED Treatments / Results  Labs (all labs ordered are listed, but only abnormal results are displayed) Labs Reviewed - No data to display  EKG  EKG Interpretation  Date/Time:  Friday March 13 2017  12:48:23 EDT Ventricular Rate:  74 PR Interval:    QRS Duration: 91 QT Interval:  374 QTC Calculation: 415 R Axis:   48 Text Interpretation:  Sinus rhythm No significant change was found Confirmed by Azalia Bilisampos, Aiyden Lauderback ([id number]) on 03/13/2017 1:18:34 PM       Radiology Dg Chest 2 View  Result Date: 03/13/2017 CLINICAL DATA:  Two-day history of shortness of breath EXAM: CHEST  2 VIEW COMPARISON:  08/04/2016 FINDINGS: The heart size and mediastinal contours are within normal limits. Both lungs are clear. The visualized skeletal structures are unremarkable. IMPRESSION: No active  cardiopulmonary disease. Electronically Signed   By: Kennith CenterEric  Mansell M.D.   On: 03/13/2017 13:01    Procedures Procedures (including critical care time)  Medications Ordered in ED Medications  predniSONE (DELTASONE) tablet 60 mg (60 mg Oral Given 03/13/17 1319)  albuterol (PROVENTIL) (2.5 MG/3ML) 0.083% nebulizer solution 5 mg (5 mg Nebulization Given 03/13/17 1319)  ibuprofen (ADVIL,MOTRIN) tablet 600 mg (600 mg Oral Given 03/13/17 1319)     Initial Impression / Assessment and Plan / ED Course  I have reviewed the triage vital signs and the nursing notes.  Pertinent labs & imaging results that were available during my care of the patient were reviewed by me and considered in my medical decision making (see chart for details).     1:55 PM Resolution of symptoms.  Patient feels much better.  Home with short course of steroids.  He has albuterol at home.  He understands return to the ER for new or worsening symptoms  Final Clinical Impressions(s) / ED Diagnoses   Final diagnoses:  Moderate persistent asthma with exacerbation    New Prescriptions New Prescriptions   PREDNISONE (DELTASONE) 20 MG TABLET    Take 2 tablets (40 mg total) by mouth daily.     Azalia Bilisampos, Halimah Bewick, MD 03/13/17 1355

## 2017-05-12 DIAGNOSIS — M549 Dorsalgia, unspecified: Secondary | ICD-10-CM | POA: Diagnosis not present

## 2017-05-14 DIAGNOSIS — M9902 Segmental and somatic dysfunction of thoracic region: Secondary | ICD-10-CM | POA: Diagnosis not present

## 2017-05-14 DIAGNOSIS — M9901 Segmental and somatic dysfunction of cervical region: Secondary | ICD-10-CM | POA: Diagnosis not present

## 2017-05-14 DIAGNOSIS — M9905 Segmental and somatic dysfunction of pelvic region: Secondary | ICD-10-CM | POA: Diagnosis not present

## 2017-05-14 DIAGNOSIS — M9903 Segmental and somatic dysfunction of lumbar region: Secondary | ICD-10-CM | POA: Diagnosis not present

## 2017-05-18 DIAGNOSIS — F422 Mixed obsessional thoughts and acts: Secondary | ICD-10-CM | POA: Diagnosis not present

## 2017-05-29 ENCOUNTER — Ambulatory Visit: Payer: BLUE CROSS/BLUE SHIELD | Attending: Family Medicine | Admitting: Physical Therapy

## 2017-05-29 DIAGNOSIS — M6281 Muscle weakness (generalized): Secondary | ICD-10-CM | POA: Diagnosis not present

## 2017-05-29 DIAGNOSIS — M545 Low back pain: Secondary | ICD-10-CM | POA: Diagnosis not present

## 2017-05-29 NOTE — Therapy (Signed)
[facility] [address] [place], [place], [address] Phone: [phone]   Fax:  [phone]  Physical Therapy Evaluation  Patient Details  Name: Isaiah Stevens MRN: [id number] Date of Birth: 27-Mar-1991 Referring Provider: Farris Has  Encounter Date: 05/29/2017      PT End of Session - 05/29/17 1015    Visit Number 1   Date for PT Re-Evaluation 07/24/17   PT Start Time 0931   PT Stop Time 1006   PT Time Calculation (min) 35 min   Activity Tolerance Patient tolerated treatment well   Behavior During Therapy [facility] for tasks assessed/performed      Past Medical History:  Diagnosis Date  . Anxiety   . Asthma   . OCD (obsessive compulsive disorder)     No past surgical history on file.  There were no vitals filed for this visit.       Subjective Assessment - 05/29/17 0935    Subjective Pt states he began with low back pain and now he is noticing his neck is stiff.  Back began 6 weeks ago and neck started to get stiff a couple weeks ago.  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     Limitations Lifting;Sitting;Other (comment)   Diagnostic tests no   Currently in Pain? Yes   Pain Score 6   up to 8or 9/10 with skating/kickboxing   Pain Location Back   Pain Orientation Left   Pain Descriptors / Indicators Dull   Pain Type Acute pain   Pain Radiating Towards 1x into the left leg but that hasn't happened   Pain Onset More than a month ago   Pain Frequency Constant   Aggravating Factors  picking up boxes   Pain Relieving Factors muscle relaxer, prone press up   Effect of Pain on Daily Activities skate boarding and kickboxing, jiujitsu, work can't lift   Multiple Pain Sites Yes   Pain Score 4   Pain Location Neck   Pain Orientation Medial   Pain Descriptors / Indicators Tightness;Discomfort   Pain Type Acute pain   Pain Onset 1 to 4 weeks ago   Pain Frequency Constant    Aggravating Factors  after waking up is the worst, turning   Pain Relieving Factors hot shower   Effect of Pain on Daily Activities swimming or turning head            [facility] PT Assessment - 05/29/17 0001      Assessment   Medical Diagnosis M54.9 back pain   Referring Provider Farris Has   Onset Date/Surgical Date --  6 weeks   Hand Dominance Right   Prior Therapy No     Precautions   Precautions None     Restrictions   Weight Bearing Restrictions No     Balance Screen   Has the patient fallen in the past 6 months Yes  skateboarding     Home Environment   Living Environment Private residence   Living Arrangements Spouse/significant other     Prior Function   Level of Independence Independent   Vocation Part time employment;Student   Vocation Requirements lifting     Cognition   Overall Cognitive Status Within Functional Limits for tasks assessed     Observation/Other Assessments   Focus on Therapeutic Outcomes (FOTO)  41% limited     ROM / Strength   AROM / PROM / Strength AROM;Strength     AROM  Overall AROM  Deficits   AROM Assessment Site Cervical;Lumbar   Cervical Flexion 50   Cervical Extension 35   Cervical - Right Rotation 35   Cervical - Left Rotation 40   Lumbar Flexion 75% limited   Lumbar Extension 50% limited     Strength   Strength Assessment Site Hip   Right/Left Hip Right;Left   Right Hip ADduction 4/5   Left Hip Flexion 4/5   Left Hip External Rotation 4/5   Left Hip ABduction 4/5   Left Hip ADduction 4-/5     Flexibility   Soft Tissue Assessment /Muscle Length yes   Hamstrings 50% limited   Piriformis tight Lt>Rt   Quadratus Lumborum tight Lt>Rt     Palpation   SI assessment  WNL   Palpation comment tight cervical paraspinals, lumbar, QL, glutes all Lt>Rt     Special Tests    Special Tests Lumbar   Lumbar Tests Slump Test;Straight Leg Raise     Slump test   Findings Positive   Side Right     Straight Leg Raise    Findings Positive   Side  Right     Ambulation/Gait   Gait Pattern Decreased stride length            Objective measurements completed on examination: See above findings.          [facility] Adult PT Treatment/Exercise - 05/29/17 0001      Self-Care   Self-Care Posture   Posture educated on correct sitting posture with lumbar support     Exercises   Exercises --  prone press up as seen in HEP                PT Education - 05/29/17 1016    Education provided Yes   Education Details posture, sitting, prone press up   Person(s) Educated Patient   Methods Explanation;Verbal cues;Demonstration   Comprehension Verbalized understanding;Returned demonstration          PT Short Term Goals - 05/29/17 1036      PT SHORT TERM GOAL #1   Title independent with initial HEP   Time 4   Period Weeks   Status New   Target Date 06/26/17     PT SHORT TERM GOAL #2   Title pt will report 25% reduction of back and neck pain    Time 4   Period Weeks   Status New   Target Date 06/26/17     PT SHORT TERM GOAL #3   Title pt will demonstrate good body mechanics with lifting 10#   Time 4   Period Weeks   Status New   Target Date 06/26/17           PT Long Term Goals - 05/29/17 1037      PT LONG TERM GOAL #1   Title ind with advanced HEP   Time 8   Period Weeks   Status New   Target Date 07/24/17     PT LONG TERM GOAL #2   Title increase cervical rotation to at least 50 degrees bilaterally to return to swimming   Time 8   Period Weeks   Status New   Target Date 07/24/17     PT LONG TERM GOAL #3   Title able to lift 40# box with good body mechanics due to increased core strength and body awareness in order to return to work duties   Time 8   Period Weeks   Status  New   Target Date 07/24/17     PT LONG TERM GOAL #4   Title increased lumbar flexion to only 25% limited due to improved soft tissue length for greater function with work related activities    Time 8   Period Weeks   Status New   Target Date 07/24/17     PT LONG TERM GOAL #5   Title FOTO < or = to 25% limited   Time 8   Period Weeks   Status New   Target Date 07/24/17                Plan - 05/29/17 1045    Clinical Impression Statement Pt presents to clinic with low back and neck pain and stiffness.  Pt is very active participates in many sports including skateboarding, martial arts, and swimming.  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  He iscurrently unable to do these activities and is experiencing increased neck pain.  Pt has decreased strength of core and left hip.  Decreased cervical and lumbar ROM.  Pt has tight hamstrings and increased muscle spasms throughout back and neck.  He tested positive on right side with slump and SLR test. Pt will benefit from skilled PT to address these impairments and return to functional activities.     History and Personal Factors relevant to plan of care: n/a   Clinical Presentation Evolving   Clinical Presentation due to: getting more muscle stiffness up into neck   Clinical Decision Making Low   Rehab Potential Excellent   PT Frequency 2x / week   PT Duration 8 weeks   PT Treatment/Interventions ADLs/Self Care Home Management;Biofeedback;Cryotherapy;Electrical Stimulation;Iontophoresis 4mg /ml Dexamethasone;Moist Heat;Traction;Ultrasound;Gait training;Stair training;Therapeutic activities;Therapeutic exercise;Balance training;Neuromuscular re-education;Patient/family education;Manual techniques;Passive range of motion;Dry needling;Taping   PT Next Visit Plan posture, ROM cervical and lumbar, begin core strengthening, manual and modalities for pain relief   PT Home Exercise Plan progress as needed   Consulted and Agree with Plan of Care Patient      Patient will benefit from skilled therapeutic intervention in order to improve the following deficits and impairments:  Decreased range of motion, Decreased  strength, Pain, Increased muscle spasms, Improper body mechanics  Visit Diagnosis: Acute low back pain, unspecified back pain laterality, with sciatica presence unspecified - Plan: PT plan of care cert/re-cert  Muscle weakness (generalized) - Plan: PT plan of care cert/re-cert     Problem List Patient Active Problem List   Diagnosis Date Noted  . Hemoptysis 09/10/2016  . ADHD 09/06/2009  . ALLERGIC RHINITIS 09/06/2009  . ASTHMA 09/06/2009    Vincente Poli, PT 05/29/2017, 12:01 PM  [facility] [address], [address] [place], [place], [address] Phone: [phone]   Fax:  [phone]  Name: Isaiah Stevens MRN: [id number] Date of Birth: 07-May-1991

## 2017-05-29 NOTE — Patient Instructions (Signed)
Posture - Standing   Good posture is important. Avoid slouching and forward head thrust. Maintain curve in low back and align ears over shoulders, hips over ankles.  Pull your belly button in toward your back bone. Posture Tips DO: - stand tall and erect - keep chin tucked in - keep head and shoulders in alignment - check posture regularly in mirror or large window - pull head back against headrest in car seat;  Change your position often.  Sit with lumbar support. DON'T: - slouch or slump while watching TV or reading - sit, stand or lie in one position  for too long;  Sitting is especially hard on the spine so if you sit at a desk/use the computer, then stand up often! Copyright  VHI. All rights reserved.  Posture - Sitting  Sit upright, head facing forward. Try using a roll to support lower back. Keep shoulders relaxed, and avoid rounded back. Keep hips level with knees. Avoid crossing legs for long periods. Copyright  VHI. All rights reserved.  Chronic neck strain can develop because of poor posture and faulty work habits  Postural strain related to slumped sitting and forward head posture is a leading cause of headaches, neck and upper back pain  General strengthening and flexibility exercises are helpful in the treatment of neck pain.  Most importantly, you should learn to correct the posture that may be contributing to chronic pain.   Change positions frequently  Change your work or home environment to improve posture and mechanics.     Relax hips into the mat, hold 5 sec repeat 10x, 3x/day  [facility] [address] [place], [place] [address] Phone # [phone] Fax [phone]

## 2017-06-10 ENCOUNTER — Encounter: Payer: Self-pay | Admitting: Physical Therapy

## 2017-06-10 ENCOUNTER — Ambulatory Visit: Payer: BLUE CROSS/BLUE SHIELD | Attending: Family Medicine | Admitting: Physical Therapy

## 2017-06-10 DIAGNOSIS — M545 Low back pain: Secondary | ICD-10-CM | POA: Insufficient documentation

## 2017-06-10 DIAGNOSIS — M6281 Muscle weakness (generalized): Secondary | ICD-10-CM

## 2017-06-10 NOTE — Therapy (Signed)
[facility] [address], [address] [place], [place], [address] Phone: [phone]   Fax:  [phone]  Physical Therapy Treatment  Patient Details  Name: Jatorian Renault MRN: [id number] Date of Birth: 1990/12/03 Referring Provider: Farris Has  Encounter Date: 06/10/2017      PT End of Session - 06/10/17 1531    Visit Number 2   Date for PT Re-Evaluation 07/24/17   PT Start Time 1531   PT Stop Time 1630   PT Time Calculation (min) 59 min      Past Medical History:  Diagnosis Date  . Anxiety   . Asthma   . OCD (obsessive compulsive disorder)     History reviewed. No pertinent surgical history.  There were no vitals filed for this visit.      Subjective Assessment - 06/10/17 1534    Subjective Today is not a bad day, but yesterday was the worst day I have had. The last 2-3 days I have had electrical shocks into my right foot.   Currently in Pain? Yes   Pain Score 2    Pain Location Back   Pain Orientation Lower   Pain Descriptors / Indicators Dull;Sore   Aggravating Factors  Bending   Pain Relieving Factors stretching, press ups   Multiple Pain Sites No                         [facility] Adult PT Treatment/Exercise - 06/10/17 0001      Self-Care   Self-Care Lifting   Lifting Lumbar protective body mechanics for ADLs and lifting     Lumbar Exercises: Stretches   Single Knee to Chest Stretch --  Knee to opposite shoulder 2x 4 breaths   Lower Trunk Rotation --  Rocking side to side 20x   Press Ups 5 reps   Piriformis Stretch 2 reps;20 seconds     Lumbar Exercises: Aerobic   Stationary Bike L2 x 10 min     Lumbar Exercises: Supine   Bridge 10 reps;1 second   Other Supine Lumbar Exercises Foam roll decompression for status updatex 4 min, then initiated core activation with ball squeeze 10x. Marching 6x,      Cryotherapy   Number Minutes Cryotherapy 10 Minutes   Cryotherapy Location  Lumbar Spine   Type of Cryotherapy Ice pack     Electrical Stimulation   Electrical Stimulation Location lumbar   Electrical Stimulation Action IFC    Electrical Stimulation Parameters 80-15 HZ   Electrical Stimulation Goals Pain                  PT Short Term Goals - 05/29/17 1036      PT SHORT TERM GOAL #1   Title independent with initial HEP   Time 4   Period Weeks   Status New   Target Date 06/26/17     PT SHORT TERM GOAL #2   Title pt will report 25% reduction of back and neck pain    Time 4   Period Weeks   Status New   Target Date 06/26/17     PT SHORT TERM GOAL #3   Title pt will demonstrate good body mechanics with lifting 10#   Time 4   Period Weeks   Status New   Target Date 06/26/17           PT Long Term Goals - 05/29/17 1037      PT LONG TERM GOAL #  1   Title ind with advanced HEP   Time 8   Period Weeks   Status New   Target Date 07/24/17     PT LONG TERM GOAL #2   Title increase cervical rotation to at least 50 degrees bilaterally to return to swimming   Time 8   Period Weeks   Status New   Target Date 07/24/17     PT LONG TERM GOAL #3   Title able to lift 40# box with good body mechanics due to increased core strength and body awareness in order to return to work duties   Time 8   Period Weeks   Status New   Target Date 07/24/17     PT LONG TERM GOAL #4   Title increased lumbar flexion to only 25% limited due to improved soft tissue length for greater function with work related activities   Time 8   Period Weeks   Status New   Target Date 07/24/17     PT LONG TERM GOAL #5   Title FOTO < or = to 25% limited   Time 8   Period Weeks   Status New   Target Date 07/24/17               Plan - 06/10/17 1556    Clinical Impression Statement Pt presents for first treatment since evaluation today. pt is compliant with his initial HEP and is adding in yoga classes.  Pain is about the same overall but he just started  PT so it is early.  Post PT pt reports feeling good.    Rehab Potential Excellent   PT Frequency 2x / week   PT Duration 8 weeks   PT Treatment/Interventions ADLs/Self Care Home Management;Biofeedback;Cryotherapy;Electrical Stimulation;Iontophoresis 4mg /ml Dexamethasone;Moist Heat;Traction;Ultrasound;Gait training;Stair training;Therapeutic activities;Therapeutic exercise;Balance training;Neuromuscular re-education;Patient/family education;Manual techniques;Passive range of motion;Dry needling;Taping   PT Next Visit Plan Hip stretches, core strength, estim if helpful      Patient will benefit from skilled therapeutic intervention in order to improve the following deficits and impairments:  Decreased range of motion, Decreased strength, Pain, Increased muscle spasms, Improper body mechanics  Visit Diagnosis: Acute low back pain, unspecified back pain laterality, with sciatica presence unspecified  Muscle weakness (generalized)     Problem List Patient Active Problem List   Diagnosis Date Noted  . Hemoptysis 09/10/2016  . ADHD 09/06/2009  . ALLERGIC RHINITIS 09/06/2009  . ASTHMA 09/06/2009    Lelar Farewell, PTA 06/10/2017, 4:19 PM  [facility] [address] [place], [place], [address] Phone: [phone]   Fax:  [phone]  Name: Ronny FlurryJoshua Ciriello MRN: [id number] Date of Birth: [birth year]

## 2017-06-15 ENCOUNTER — Ambulatory Visit: Payer: BLUE CROSS/BLUE SHIELD | Admitting: Physical Therapy

## 2017-06-15 ENCOUNTER — Encounter: Payer: Self-pay | Admitting: Physical Therapy

## 2017-06-15 DIAGNOSIS — M6281 Muscle weakness (generalized): Secondary | ICD-10-CM | POA: Diagnosis not present

## 2017-06-15 DIAGNOSIS — M545 Low back pain: Secondary | ICD-10-CM | POA: Diagnosis not present

## 2017-06-15 NOTE — Therapy (Signed)
[facility] [address], [address] [place], [place], [address] Phone: [phone]   Fax:  [phone]  Physical Therapy Treatment  Patient Details  Name: Isaiah FlurryJoshua Stevens MRN: [id number] Date of Birth: 1991/02/15 Referring Provider: Farris HasAaron Morrow  Encounter Date: 06/15/2017      PT End of Session - 06/15/17 1531    Visit Number 3   Date for PT Re-Evaluation 07/24/17   PT Start Time 1525   PT Stop Time 1625   PT Time Calculation (min) 60 min   Activity Tolerance Patient tolerated treatment well   Behavior [facility] Therapy [facility] for tasks assessed/performed      Past Medical History:  Diagnosis Date  . Anxiety   . Asthma   . OCD (obsessive compulsive disorder)     History reviewed. No pertinent surgical history.  There were no vitals filed for this visit.      Subjective Assessment - 06/15/17 1532    Subjective A couple of days after last session I had those electric shocks into my toes but that has subsided. I have been really taking it easy  but I eventually want to get back to my normal recreational activites.    Currently in Pain? No/denies   Multiple Pain Sites No                         [facility] Adult PT Treatment/Exercise - 06/15/17 0001      Lumbar Exercises: Stretches   Active Hamstring Stretch 2 reps;30 seconds   Prone on Elbows Stretch --  prone x 1 min, POE x 2 min, press ups 5x   Piriformis Stretch 2 reps;30 seconds     Lumbar Exercises: Aerobic   Stationary Bike L2 x 7   Stopped d/t increasing LBP   Elliptical R2 L5 x 5min  VC for posture     Lumbar Exercises: Supine   Bridge 10 reps;2 seconds   Other Supine Lumbar Exercises Foam roll decompression for status updatex 4 min, then initiated core activation with ball squeeze 10x. Marching 10x,      Lumbar Exercises: Sidelying   Other Sidelying Lumbar Exercises Modified plank 20sec bil      Lumbar Exercises: Quadruped   Opposite  Arm/Leg Raise Right arm/Left leg;Left arm/Right leg;5 reps;2 seconds  VC/TC for correct position   Opposite Arm/Leg Raise Limitations F/B childs pose stretch     Cryotherapy   Number Minutes Cryotherapy 10 Minutes   Cryotherapy Location Lumbar Spine   Type of Cryotherapy Ice pack     Electrical Stimulation   Electrical Stimulation Location lumbar   Electrical Stimulation Action IFC   Electrical Stimulation Parameters 8-150 Hz   Electrical Stimulation Goals Pain                  PT Short Term Goals - 06/15/17 1531      PT SHORT TERM GOAL #1   Title independent with initial HEP   Time 4   Period Weeks   Status Achieved     PT SHORT TERM GOAL #2   Title pt will report 25% reduction of back and neck pain    Time 4   Period Weeks   Status On-going           PT Long Term Goals - 05/29/17 1037      PT LONG TERM GOAL #1   Title ind with advanced HEP   Time 8   Period Weeks  Status New   Target Date 07/24/17     PT LONG TERM GOAL #2   Title increase cervical rotation to at least 50 degrees bilaterally to return to swimming   Time 8   Period Weeks   Status New   Target Date 07/24/17     PT LONG TERM GOAL #3   Title able to lift 40# box with good body mechanics due to increased core strength and body awareness in order to return to work duties   Time 8   Period Weeks   Status New   Target Date 07/24/17     PT LONG TERM GOAL #4   Title increased lumbar flexion to only 25% limited due to improved soft tissue length for greater function with work related activities   Time 8   Period Weeks   Status New   Target Date 07/24/17     PT LONG TERM GOAL #5   Title FOTO < or = to 25% limited   Time 8   Period Weeks   Status New   Target Date 07/24/17               Plan - 06/15/17 1531    Clinical Impression Statement Pt has not had electric shocks in his feet for a few days but he is frustrated that he has not been able to skateboard or do his  martial arts yet. He reports icing his back has been very helpful as is the Estim. Core strengthening exercises are proving to be challenging as pt is quite shakey when performing them.      Rehab Potential Excellent   PT Frequency 2x / week   PT Duration 8 weeks   PT Treatment/Interventions ADLs/Self Care Home Management;Biofeedback;Cryotherapy;Electrical Stimulation;Iontophoresis /ml Dexamethasone;Moist Heat;Traction;Ultrasound;Gait training;Stair training;Therapeutic activities;Therapeutic exercise;Balance training;Neuromuscular re-education;Patient/family education;Manual techniques;Passive range of motion;Dry needling;Taping   PT Next Visit Plan Hip stretches, core strength, estim if helpful   Consulted and Agree with Plan of Care Patient      Patient will benefit from skilled therapeutic intervention in order to improve the following deficits and impairments:  Decreased range of motion, Decreased strength, Pain, Increased muscle spasms, Improper body mechanics  Visit Diagnosis: Acute low back pain, unspecified back pain laterality, with sciatica presence unspecified  Muscle weakness (generalized)     Problem List Patient Active Problem List   Diagnosis Date Noted  . Hemoptysis 09/10/2016  . ADHD 09/06/2009  . ALLERGIC RHINITIS 09/06/2009  . ASTHMA 09/06/2009    Dennise Bamber, PTA 06/15/2017, 4:06 PM  [facility] [address], [address] [place], [place], [address] Phone: [phone]   Fax:  [phone]  Name: Isaiah Stevens MRN: [id number] Date of Birth: 08-05-1991

## 2017-06-24 ENCOUNTER — Ambulatory Visit: Payer: BLUE CROSS/BLUE SHIELD | Admitting: Physical Therapy

## 2017-06-24 DIAGNOSIS — M6281 Muscle weakness (generalized): Secondary | ICD-10-CM

## 2017-06-24 DIAGNOSIS — M545 Low back pain: Secondary | ICD-10-CM

## 2017-06-24 NOTE — Therapy (Addendum)
[facility] [address], [place], [place], [address] Phone: [phone]   Fax:  [phone]  Physical Therapy Treatment/Discharge   Patient Details  Name: Isaiah Stevens MRN: [id number] Date of Birth: [birth year] Referring Provider: London Pepper  Encounter Date: 06/24/2017      PT End of Session - 06/24/17 1554    Visit Number 4   Date for PT Re-Evaluation 07/24/17   PT Start Time 9622   PT Stop Time 2979   PT Time Calculation (min) 44 min   Activity Tolerance Patient tolerated treatment well;No increased pain   Behavior During Therapy WFL for tasks assessed/performed      Past Medical History:  Diagnosis Date  . Anxiety   . Asthma   . OCD (obsessive compulsive disorder)     No past surgical history on file.  There were no vitals filed for this visit.      Subjective Assessment - 06/24/17 1531    Subjective Pt reports that things are slowly improving. They are still not where he would like it to be, but he is optimistic.    Currently in Pain? Yes   Pain Score 2    Pain Location Back   Pain Orientation Lower;Mid   Pain Descriptors / Indicators Dull   Pain Type Acute pain   Pain Radiating Towards none    Pain Onset More than a month ago   Pain Frequency Constant   Aggravating Factors  bending    Pain Relieving Factors stretching, press ups    Effect of Pain on Daily Activities unable to complete regular daily and leisure activity                         [facility] Adult PT Treatment/Exercise - 06/24/17 0001      Exercises   Exercises Other Exercises   Other Exercises  tall kneeling hip hinge with green TB resistance around the pelvis x10 reps, heavy cuing to improve technique     Lumbar Exercises: Stretches   Single Knee to Chest Stretch 5 reps;10 seconds   Double Knee to Chest Stretch Limitations x20 reps with physioball and BUE pressdown      Lumbar Exercises: Supine   Other  Supine Lumbar Exercises deadbug opposite UE/LE x10 reps each      Lumbar Exercises: Sidelying   Other Sidelying Lumbar Exercises thoracic rotation mobilization x15 reps each direction      Lumbar Exercises: Quadruped   Single Arm Raise Left;Right;10 reps   Straight Leg Raise 10 reps   Straight Leg Raises Limitations each LE   Opposite Arm/Leg Raise Right arm/Left leg;Left arm/Right leg;10 reps   Opposite Arm/Leg Raise Limitations verbal cues to decrease trunk rotation                PT Education - 06/24/17 1551    Education provided Yes   Education Details discussed use of towel roll for additional lumbar support and decreased pain; addition to HEP; discussed safe exercises to complete at the gym and others to avoid until further improvements in low back pain and core strength    Person(s) Educated Patient   Methods Explanation;Demonstration;Verbal cues;Handout   Comprehension Returned demonstration;Verbalized understanding          PT Short Term Goals - 06/15/17 1531      PT SHORT TERM GOAL #1   Title independent with initial HEP   Time 4   Period Weeks  Status Achieved     PT SHORT TERM GOAL #2   Title pt will report 25% reduction of back and neck pain    Time 4   Period Weeks   Status On-going           PT Long Term Goals - 05/29/17 1037      PT LONG TERM GOAL #1   Title ind with advanced HEP   Time 8   Period Weeks   Status New   Target Date 07/24/17     PT LONG TERM GOAL #2   Title increase cervical rotation to at least 50 degrees bilaterally to return to swimming   Time 8   Period Weeks   Status New   Target Date 07/24/17     PT LONG TERM GOAL #3   Title able to lift 40# box with good body mechanics due to increased core strength and body awareness in order to return to work duties   Time 8   Period Weeks   Status New   Target Date 07/24/17     PT LONG TERM GOAL #4   Title increased lumbar flexion to only 25% limited due to improved  soft tissue length for greater function with work related activities   Time 8   Period Weeks   Status New   Target Date 07/24/17     PT LONG TERM GOAL #5   Title FOTO < or = to 25% limited   Time 8   Period Weeks   Status New   Target Date 07/24/17               Plan - 06/24/17 1556    Clinical Impression Statement Pt is making steady progress towards goals per his report upon arrival to today's session. Pt had several concerns and questions for the therapist which were addressed and pt verbalized solid understanding of these concepts. Introduced new exercises to promote core stability in addition to thoracic mobility work due to noted limitations in rotation specifically. Ended session with several additions made to pt's HEP with demonstrated understanding. Pt reporting improved pain by the end of the session.   Rehab Potential Excellent   PT Frequency 2x / week   PT Duration 8 weeks   PT Treatment/Interventions ADLs/Self Care Home Management;Biofeedback;Cryotherapy;Electrical Stimulation;Iontophoresis '4mg'$ /ml Dexamethasone;Moist Heat;Traction;Ultrasound;Gait training;Stair training;Therapeutic activities;Therapeutic exercise;Balance training;Neuromuscular re-education;Patient/family education;Manual techniques;Passive range of motion;Dry needling;Taping   PT Next Visit Plan progress core strength and lumbopelvic control; estim and modalities if needed    PT Home Exercise Plan added deadbug, low trunk rotation and lumbar roll    Consulted and Agree with Plan of Care Patient      Patient will benefit from skilled therapeutic intervention in order to improve the following deficits and impairments:  Decreased range of motion, Decreased strength, Pain, Increased muscle spasms, Improper body mechanics  Visit Diagnosis: Acute low back pain, unspecified back pain laterality, with sciatica presence unspecified  Muscle weakness (generalized)     Problem List Patient Active Problem  List   Diagnosis Date Noted  . Hemoptysis 09/10/2016  . ADHD 09/06/2009  . ALLERGIC RHINITIS 09/06/2009  . ASTHMA 09/06/2009    4:26 PM,06/24/17 Elly Modena PT, DPT [place] [facility] [address] [place], [place], [address] Phone: [phone]   Fax:  [phone]  Name: Isaiah Stevens MRN: [id number] Date of Birth: 1991-03-10    *Addendum to d/c pt  from PT and resolve episode of care  PHYSICAL THERAPY DISCHARGE SUMMARY  Visits from Start of Care: 4  Current functional level related to goals / functional outcomes: See above for more details    Remaining deficits: See above for more details    Education / Equipment: See above for more details  Plan: Patient agrees to discharge.  Patient goals were not met. Patient is being discharged due to not returning since the last visit.  ?????     Pt has not returned since his last appointment on 06/24/17. He is being discharged at this time due to lack of attendance and is welcome to return with a new physician order.   2:58 PM,09/15/17 Elly Modena PT, [place] [facility] [place]

## 2017-08-03 DIAGNOSIS — Z23 Encounter for immunization: Secondary | ICD-10-CM | POA: Diagnosis not present

## 2017-08-03 DIAGNOSIS — M549 Dorsalgia, unspecified: Secondary | ICD-10-CM | POA: Diagnosis not present

## 2017-08-03 DIAGNOSIS — F411 Generalized anxiety disorder: Secondary | ICD-10-CM | POA: Diagnosis not present

## 2017-08-04 ENCOUNTER — Other Ambulatory Visit: Payer: Self-pay | Admitting: Family Medicine

## 2017-08-04 DIAGNOSIS — M545 Low back pain: Secondary | ICD-10-CM

## 2017-08-16 ENCOUNTER — Ambulatory Visit
Admission: RE | Admit: 2017-08-16 | Discharge: 2017-08-16 | Disposition: A | Discharge status: Home / Self Care | Payer: BLUE CROSS/BLUE SHIELD | Source: Ambulatory Visit | Attending: Family Medicine | Admitting: Family Medicine

## 2017-08-16 DIAGNOSIS — M5127 Other intervertebral disc displacement, lumbosacral region: Secondary | ICD-10-CM | POA: Diagnosis not present

## 2017-08-16 DIAGNOSIS — M545 Low back pain: Secondary | ICD-10-CM

## 2017-09-26 DIAGNOSIS — F422 Mixed obsessional thoughts and acts: Secondary | ICD-10-CM | POA: Diagnosis not present

## 2017-10-01 IMAGING — CR DG CHEST 2V
2 series · 2 of 2 positions shown · non-contrast
Comparison: 04/30/2016.

CLINICAL DATA: Cough.

EXAM:
CHEST  2 VIEW

[w chest pa]
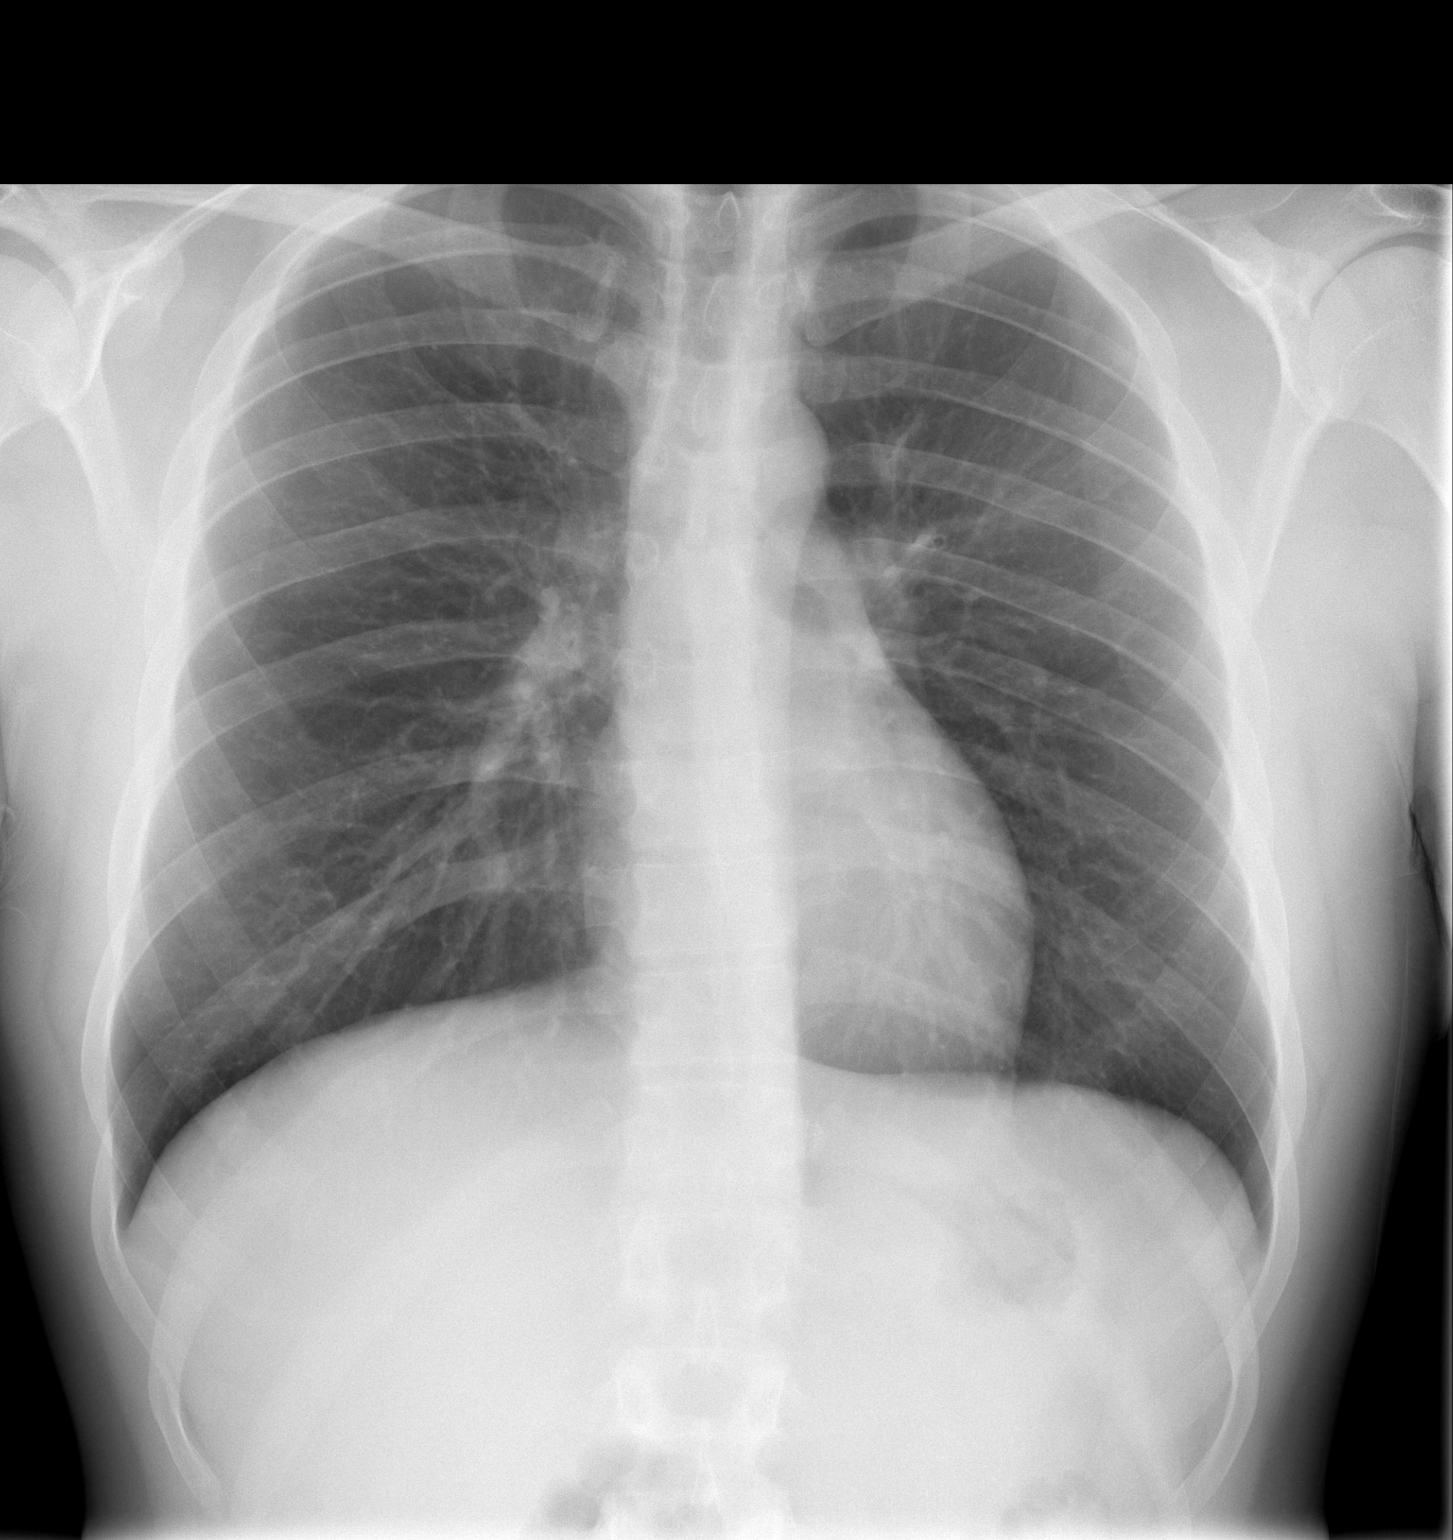

[w chest lat]
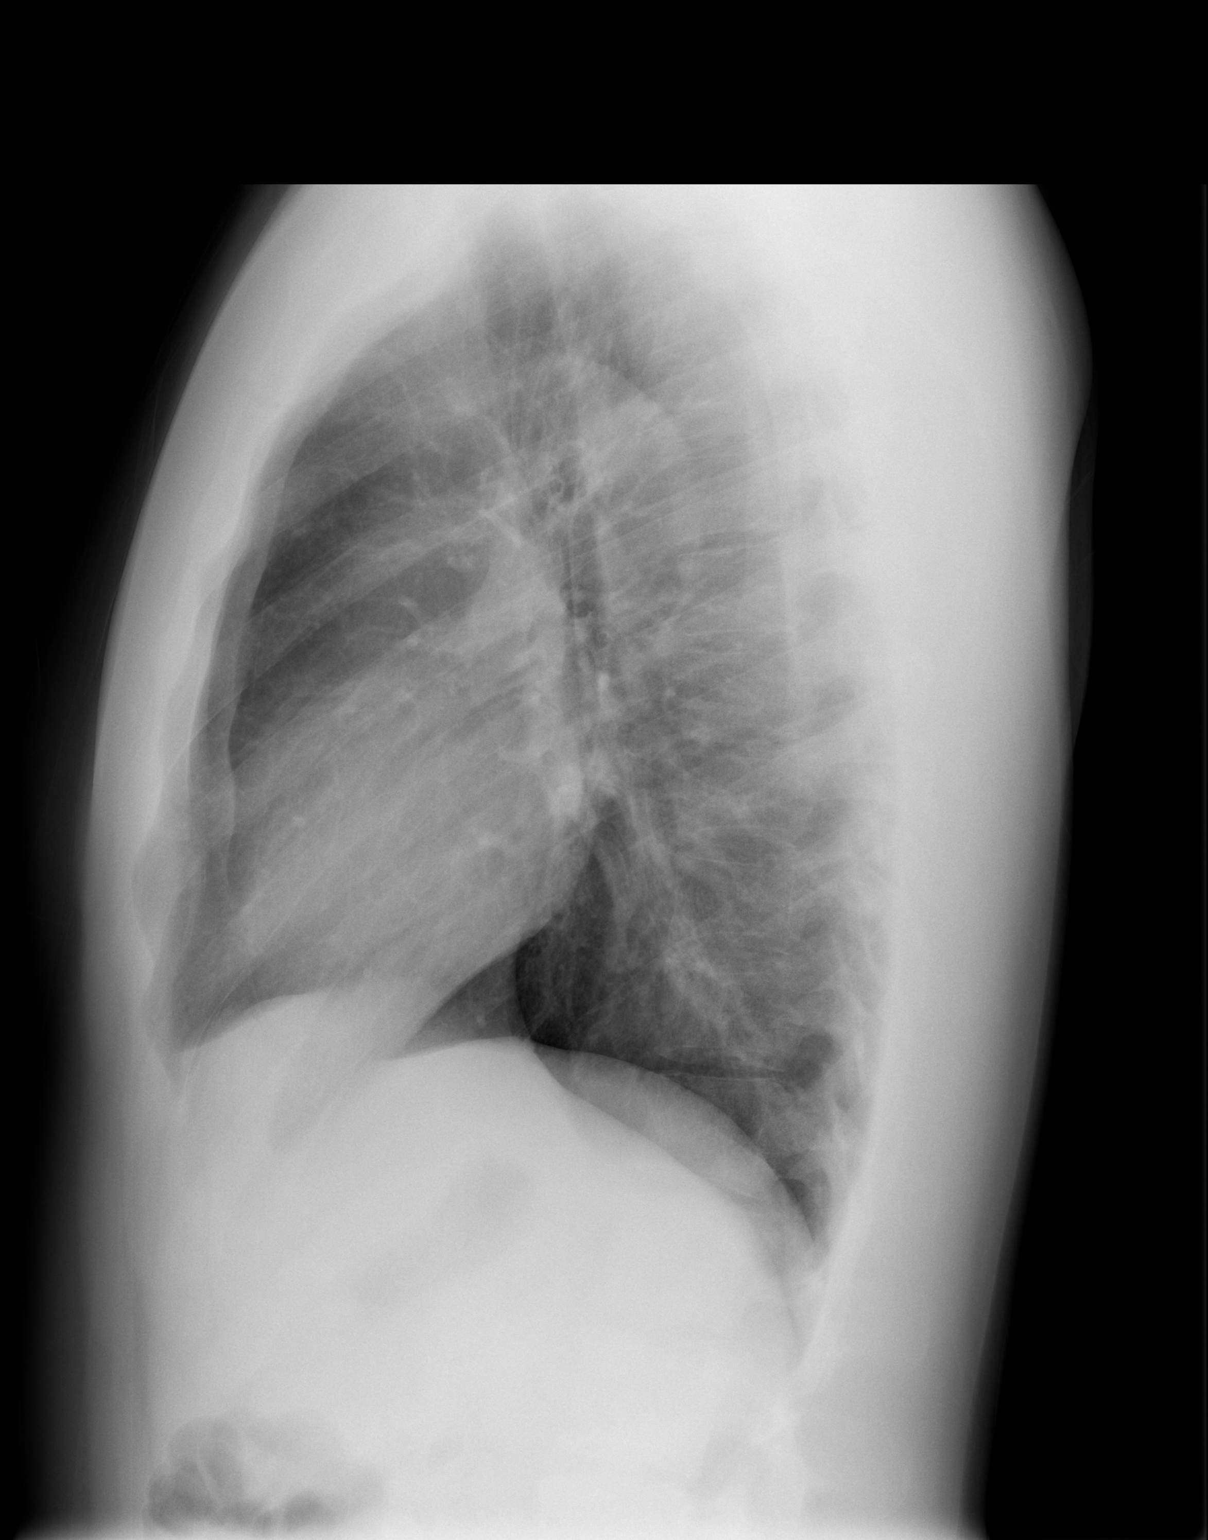

[2 of 2 positions shown; findings below may reference images not displayed]

FINDINGS: Mediastinum and hilar structures normal. Lungs are clear. No pleural
effusion or pneumothorax.
IMPRESSION: No acute cardiopulmonary disease.

## 2017-10-10 DIAGNOSIS — F422 Mixed obsessional thoughts and acts: Secondary | ICD-10-CM | POA: Diagnosis not present

## 2017-10-23 ENCOUNTER — Encounter: Payer: Self-pay | Admitting: *Deleted

## 2017-10-24 DIAGNOSIS — F422 Mixed obsessional thoughts and acts: Secondary | ICD-10-CM | POA: Diagnosis not present

## 2017-10-26 ENCOUNTER — Encounter: Payer: Self-pay | Admitting: Diagnostic Neuroimaging

## 2017-10-26 ENCOUNTER — Encounter (INDEPENDENT_AMBULATORY_CARE_PROVIDER_SITE_OTHER): Payer: Self-pay

## 2017-10-26 ENCOUNTER — Ambulatory Visit: Payer: BLUE CROSS/BLUE SHIELD | Admitting: Diagnostic Neuroimaging

## 2017-10-26 VITALS — BP 109/68 | HR 76 | Ht 74.0 in | Wt 221.0 lb

## 2017-10-26 DIAGNOSIS — G8929 Other chronic pain: Secondary | ICD-10-CM | POA: Diagnosis not present

## 2017-10-26 DIAGNOSIS — M546 Pain in thoracic spine: Secondary | ICD-10-CM | POA: Diagnosis not present

## 2017-10-26 NOTE — Progress Notes (Signed)
[facility]  PATIENT: Isaiah Stevens DOB: 12/16/1990  REFERRING CLINICIAN: A Morrow HISTORY FROM: patient  REASON FOR VISIT: new consult    HISTORICAL  CHIEF COMPLAINT:  Chief Complaint  Patient presents with  . Follow-up  . R sided leg and back pain    5-6 months hx (hx of jitsu, skateboarding). Just woke up with back pain, has done PT and seen.   Had MRI [place] Img.    HISTORY OF PRESENT ILLNESS:   27 year old male here for evaluation of back pain.  August 2018 patient woke up with severe back pain, thoracic and low back pain, radiating into the right leg.  No specific injuring factor.  Patient monitor this and try yoga for 2 months.  Then he went to medical doctor and was sent to physical therapy.  He had MRI of the lumbar spine which showed minimal disc bulging at L5-S1.  Symptoms have improved but now he has ongoing migratory paraspinal back pain in the lower thoracic region.  This is worse when he is doing certain physical activities, bending, twisting, lifting.  Patient has been very active throughout his life.  He previously played lacrosse, swam, and more recently has been strength training, skateboarding and doing jujitsu.  He previously had excellent nutrition.  However in the last few years he has gained some weight, especially since his injury in August 2018.  In 2015 patient weighed 170 pounds.  This gradually increased to 200 pounds about 1 year ago.  Since his injury patient has gained weight up to 220 pounds.  His nutrition has significantly declined.  He has significantly cut down on activity due to his pain.   REVIEW OF SYSTEMS: Full 14 system review of systems performed and negative with exception of: Weakness anxiety racing thoughts cramps aching muscles allergies runny nose shortness of breath fatigue.  ALLERGIES: No Known Allergies  HOME MEDICATIONS: Outpatient Medications Prior to Visit  Medication Sig Dispense Refill  . albuterol  (PROVENTIL HFA;VENTOLIN HFA) 108 (90 BASE) MCG/ACT inhaler Inhale 2 puffs into the lungs every 6 (six) hours as needed.    [name] albuterol (PROVENTIL) (2.5 MG/3ML) 0.083% nebulizer solution Take 2.5 mg by nebulization every 4 (four) hours as needed for wheezing or shortness of breath.    . amphetamine-dextroamphetamine (ADDERALL XR) 15 MG 24 hr capsule Take 15 mg by mouth every morning.    . escitalopram (LEXAPRO) 10 MG tablet Take 10 mg by mouth daily.    . Fluticasone-Salmeterol (ADVAIR) 100-50 MCG/DOSE AEPB Inhale 1 puff into the lungs every 12 (twelve) hours.    [name] loratadine (CLARITIN) 10 MG tablet Take 10 mg by mouth daily as needed for allergies.    [name] LORazepam (ATIVAN) 1 MG tablet Take 1 tablet (1 mg total) by mouth 3 (three) times daily as needed for anxiety. 10 tablet 0  . methocarbamol (ROBAXIN) 500 MG tablet Take 500-1,000 mg by mouth daily as needed for muscle spasms.    . montelukast (SINGULAIR) 10 MG tablet Take 10 mg by mouth at bedtime.     No facility-administered medications prior to visit.     PAST MEDICAL HISTORY: Past Medical History:  Diagnosis Date  . Acne   . ADHD   . Anxiety   . Asthma   . Eczema   . OCD (obsessive compulsive disorder)     PAST SURGICAL HISTORY: No past surgical history on file.  FAMILY HISTORY: No family history on file.  SOCIAL HISTORY:  Social History  Socioeconomic History  . Marital status: Single    Spouse name: Not on file  . Number of children: Not on file  . Years of education: Not on file  . Highest education level: Not on file  Social Needs  . Financial resource strain: Not on file  . Food insecurity - worry: Not on file  . Food insecurity - inability: Not on file  . Transportation needs - medical: Not on file  . Transportation needs - non-medical: Not on file  Occupational History  . Not on file  Tobacco Use  . Smoking status: Current Some Day Smoker    Types: E-cigarettes  . Smokeless tobacco: Never Used  .  Tobacco comment: Social smoker, does NOT smoke every day. Maybe 3/4 a pack a month.   Substance and Sexual Activity  . Alcohol use: Yes    Comment: occasional   . Drug use: No  . Sexual activity: Not on file  Other Topics Concern  . Not on file  Social History Narrative   Caffeine 2 cups coffee a week.  Lives at home with girlfriend [place]).  Works for a Training and development officer.  Education [employer].  No  Children.       PHYSICAL EXAM  GENERAL EXAM/CONSTITUTIONAL: Vitals:  Vitals:   10/26/17 0814  BP: 109/68  Pulse: 76  Weight: 221 lb (100.2 kg)  Height: 6\' 2"  (1.88 m)     Body mass index is 28.37 kg/m.  Visual Acuity Screening   Right eye Left eye Both eyes  Without correction: 20/20 20/40   With correction:        Patient is in no distress; well developed, nourished and groomed; neck is supple  CARDIOVASCULAR:  Examination of carotid arteries is normal; no carotid bruits  Regular rate and rhythm, no murmurs  Examination of peripheral vascular system by observation and palpation is normal  EYES:  Ophthalmoscopic exam of optic discs and posterior segments is normal; no papilledema or hemorrhages  MUSCULOSKELETAL:  Gait, strength, tone, movements noted in Neurologic exam below  NEUROLOGIC: MENTAL STATUS:  No flowsheet data found.  awake, alert, oriented to person, place and time  recent and remote memory intact  normal attention and concentration  language fluent, comprehension intact, naming intact,   fund of knowledge appropriate  CRANIAL NERVE:   2nd - no papilledema on fundoscopic exam  2nd, 3rd, 4th, 6th - pupils equal and reactive to light, visual fields full to confrontation, extraocular muscles intact, no nystagmus  5th - facial sensation symmetric  7th - facial strength symmetric  8th - hearing intact  9th - palate elevates symmetrically, uvula midline  11th - shoulder shrug symmetric  12th - tongue protrusion midline  MOTOR:   normal  bulk and tone, full strength in the BUE, BLE  SENSORY:   normal and symmetric to light touch, temperature, vibration  COORDINATION:   finger-nose-finger, fine finger movements normal  REFLEXES:   deep tendon reflexes present and symmetric  GAIT/STATION:   narrow based gait    DIAGNOSTIC DATA (LABS, IMAGING, TESTING) - I reviewed patient records, labs, notes, testing and imaging myself where available.  No results found for: WBC, HGB, HCT, MCV, PLT No results found for: NA, K, CL, CO2, GLUCOSE, BUN, CREATININE, CALCIUM, PROT, ALBUMIN, AST, ALT, ALKPHOS, BILITOT, GFRNONAA, GFRAA No results found for: CHOL, HDL, LDLCALC, LDLDIRECT, TRIG, CHOLHDL No results found for: ZOXW9U No results found for: VITAMINB12 No results found for: TSH   08/16/17 MRI lumbar spine [I  reviewed images myself and agree with interpretation. -VRP]  1. Small L5-S1 RIGHT subarticular disc protrusion with annular fissure encroaches upon the traversing RIGHT S1 nerve. 2. No canal stenosis. Minimal RIGHT L5-S1 neural foraminal narrowing. 3. No fracture or malalignment.     ASSESSMENT AND PLAN  27 y.o. year old male here with chronic back pain since August 2018, likely musculoskeletal strain.  Also has tiny disc bulging at L5-S1 which may have caused symptoms in the summer 2018, but now resolved.  Now with secondary problems of poor nutrition, decreased activity, weight gain.  I reviewed signs, symptoms, MRI scan, diagnosis and prognosis with treatment options.  I recommend patient return to optimizing his nutrition and physical activity.  He may also pursue recovery through different avenues such as a Systems analystpersonal trainer, physical therapy, chiropractor, acupuncture, massage therapy.  Dx: thoracic / back pain --> muscle strain / spasm  1. Chronic bilateral thoracic back pain      PLAN: - consider PT, massage, chiropractor - optimize nutrition  Return if symptoms worsen or fail to improve, for return  to PCP.    Suanne MarkerVIKRAM R. PENUMALLI, MD 10/26/2017, 8:50 AM Certified in Neurology, Neurophysiology and Neuroimaging  [facility] [address], [address] [place], [place] [address] [phone]

## 2017-11-04 DIAGNOSIS — J45909 Unspecified asthma, uncomplicated: Secondary | ICD-10-CM | POA: Diagnosis not present

## 2017-11-04 DIAGNOSIS — M549 Dorsalgia, unspecified: Secondary | ICD-10-CM | POA: Diagnosis not present

## 2017-11-04 DIAGNOSIS — F411 Generalized anxiety disorder: Secondary | ICD-10-CM | POA: Diagnosis not present

## 2017-11-04 DIAGNOSIS — F900 Attention-deficit hyperactivity disorder, predominantly inattentive type: Secondary | ICD-10-CM | POA: Diagnosis not present

## 2017-11-24 ENCOUNTER — Encounter (HOSPITAL_BASED_OUTPATIENT_CLINIC_OR_DEPARTMENT_OTHER): Payer: Self-pay

## 2017-11-24 ENCOUNTER — Other Ambulatory Visit: Payer: Self-pay

## 2017-11-24 ENCOUNTER — Emergency Department (HOSPITAL_BASED_OUTPATIENT_CLINIC_OR_DEPARTMENT_OTHER): Payer: BLUE CROSS/BLUE SHIELD

## 2017-11-24 ENCOUNTER — Emergency Department (HOSPITAL_BASED_OUTPATIENT_CLINIC_OR_DEPARTMENT_OTHER)
Admission: EM | Admit: 2017-11-24 | Discharge: 2017-11-24 | Disposition: A | Discharge status: Home / Self Care | Payer: BLUE CROSS/BLUE SHIELD | Attending: Emergency Medicine | Admitting: Emergency Medicine

## 2017-11-24 DIAGNOSIS — R079 Chest pain, unspecified: Secondary | ICD-10-CM | POA: Diagnosis not present

## 2017-11-24 DIAGNOSIS — J45909 Unspecified asthma, uncomplicated: Secondary | ICD-10-CM | POA: Diagnosis not present

## 2017-11-24 DIAGNOSIS — F17210 Nicotine dependence, cigarettes, uncomplicated: Secondary | ICD-10-CM | POA: Diagnosis not present

## 2017-11-24 DIAGNOSIS — Z79899 Other long term (current) drug therapy: Secondary | ICD-10-CM | POA: Diagnosis not present

## 2017-11-24 DIAGNOSIS — R55 Syncope and collapse: Secondary | ICD-10-CM | POA: Diagnosis not present

## 2017-11-24 DIAGNOSIS — F419 Anxiety disorder, unspecified: Secondary | ICD-10-CM | POA: Insufficient documentation

## 2017-11-24 HISTORY — DX: Panic disorder (episodic paroxysmal anxiety): F41.0

## 2017-11-24 NOTE — Discharge Instructions (Signed)
Schedule a follow-up appointment with your doctor tomorrow.  If your symptoms recur please return to the emergency room.  These make sure that you are eating and drinking multiple times throughout the day.  Please drink until your peeing every few hours.  Please eat frequent small meals.  Please take time when going from laying to sitting, and sitting to standing to allow your body to adjust.

## 2017-11-24 NOTE — ED Notes (Signed)
ED Provider at bedside. 

## 2017-11-24 NOTE — ED Notes (Signed)
Patient transported to X-ray 

## 2017-11-24 NOTE — ED Triage Notes (Signed)
C/o CP started approx 545 while driving-was pale-states he feels anxious and "fatigued"-denies CP-hx pf panic attacks but states "this feels different"-NAD-steady gait

## 2017-11-24 NOTE — ED Provider Notes (Signed)
[facility] EMERGENCY DEPARTMENT Provider Note   CSN: [id number] Arrival date & time: 11/24/17  1948     History   Chief Complaint Chief Complaint  Patient presents with  . Chest Pain    HPI Isaiah FlurryJoshua Stevens is a 27 y.o. male with a history of anxiety, OCD, panic attacks, asthma, who presents today for evaluation of feeling like he was going to pass out.  He reports that around 545pm while driving he started feeling anxious and tired and his chest felt tight.  This lasted for approximately 1 hour and resolved after eating and drinking.  Currently his only complaint is feeling tired, no CP, SOB.  He reports that he did not eat or drink well today, and that his symptoms started to go away after he ate and drank prior to arrival..  His girlfriend reports that during this episode he appeared pale.  He does not personal history of PE or DVT, no recent surgeries or immobilization, no personal history of cancer.  He has not been sick recently, no fevers or coughs.  He has not had any new exposures recently to foods or products, no hives, itching or rashes.  He does use e-cigarettes, and switched which brand he was using about 1-2 weeks ago.    He denies any history of anemia, no dark, tarry or bloody bowel movements.    HPI  Past Medical History:  Diagnosis Date  . Acne   . ADHD   . Anxiety   . Asthma   . Eczema   . OCD (obsessive compulsive disorder)   . Panic attack     Patient Active Problem List   Diagnosis Date Noted  . Hemoptysis 09/10/2016  . ADHD 09/06/2009  . ALLERGIC RHINITIS 09/06/2009  . ASTHMA 09/06/2009    History reviewed. No pertinent surgical history.     Home Medications    Prior to Admission medications   Medication Sig Start Date End Date Taking? Authorizing Provider  albuterol (PROVENTIL HFA;VENTOLIN HFA) 108 (90 BASE) MCG/ACT inhaler Inhale 2 puffs into the lungs every 6 (six) hours as needed.    [provider]  albuterol  (PROVENTIL) (2.5 MG/3ML) 0.083% nebulizer solution Take 2.5 mg by nebulization every 4 (four) hours as needed for wheezing or shortness of breath.    [provider]  amphetamine-dextroamphetamine (ADDERALL XR) 15 MG 24 hr capsule Take 15 mg by mouth every morning.    [provider]  escitalopram (LEXAPRO) 10 MG tablet Take 10 mg by mouth daily.    [provider]  Fluticasone-Salmeterol (ADVAIR) 100-50 MCG/DOSE AEPB Inhale 1 puff into the lungs every 12 (twelve) hours.    [provider]  loratadine (CLARITIN) 10 MG tablet Take 10 mg by mouth daily as needed for allergies.    [provider]  LORazepam (ATIVAN) 1 MG tablet Take 1 tablet (1 mg total) by mouth 3 (three) times daily as needed for anxiety. 08/04/16   Liberty HandyGibbons, Claudia J, PA-C  methocarbamol (ROBAXIN) 500 MG tablet Take 500-1,000 mg by mouth daily as needed for muscle spasms.    [provider]  montelukast (SINGULAIR) 10 MG tablet Take 10 mg by mouth at bedtime.    [provider]    Family History No family history on file.  Social History Social History   Tobacco Use  . Smoking status: Current Some Day Smoker    Types: E-cigarettes  . Smokeless tobacco: Never Used  Substance Use Topics  . Alcohol use:  Yes    Comment: occasional   . Drug use: No     Allergies   Patient has no known allergies.   Review of Systems Review of Systems  Constitutional: Positive for fatigue. Negative for chills and fever.  Eyes: Negative for visual disturbance.  Respiratory: Positive for chest tightness (Fully resolved). Negative for cough and shortness of breath.   Gastrointestinal: Negative for abdominal pain, nausea and vomiting.  Skin: Positive for pallor (Had earlier, fully resolved.). Negative for rash.  Neurological: Positive for light-headedness (Fully resolved). Negative for dizziness, weakness and headaches.  All other systems reviewed and are  negative.    Physical Exam Updated Vital Signs BP 118/77   Pulse 60   Temp 99 F (37.2 C) (Oral)   Resp 20   Ht 6\' 1"  (1.854 m)   Wt 97.3 kg (214 lb 8.1 oz)   SpO2 98%   BMI 28.30 kg/m   Physical Exam  Constitutional: He appears well-developed and well-nourished. He does not appear ill. No distress.  HENT:  Head: Normocephalic and atraumatic.  Eyes: Conjunctivae are normal. Right eye exhibits no discharge. Left eye exhibits no discharge. No scleral icterus.  Conjunctiva area not pale, good color.   Neck: Normal range of motion.  Cardiovascular: Normal rate, regular rhythm and normal pulses.  No murmur heard. Pulses:      Radial pulses are 2+ on the right side, and 2+ on the left side.       Dorsalis pedis pulses are 2+ on the right side, and 2+ on the left side.       Posterior tibial pulses are 2+ on the right side, and 2+ on the left side.  Pulmonary/Chest: Effort normal and breath sounds normal. No stridor. No respiratory distress. He has no decreased breath sounds.  Abdominal: Soft. Bowel sounds are normal. He exhibits no distension.  Musculoskeletal: He exhibits no edema or deformity.       Right lower leg: Normal. He exhibits no edema.       Left lower leg: Normal. He exhibits no edema.  5/5 strength in bilateral upper and lower extremities.   Neurological: He is alert. He has normal strength. He displays no tremor. He exhibits normal muscle tone.  Patient smile and facial movements are symmetrical.  5/5 strength in bilateral upper and lower extremities.  He has awake, alert, and interacting appropriately.  Able to provide coherent history and answer questions.  No obvious neurologic deficits.    Skin: Skin is warm and dry. He is not diaphoretic.  Psychiatric: He has a normal mood and affect. His behavior is normal.  Nursing note and vitals reviewed.    ED Treatments / Results  Labs (all labs ordered are listed, but only abnormal results are displayed) Labs  Reviewed - No data to display  EKG  EKG Interpretation  Date/Time:  Tuesday November 24 2017 20:01:10 EST Ventricular Rate:  78 PR Interval:  152 QRS Duration: 84 QT Interval:  364 QTC Calculation: 414 R Axis:   72 Text Interpretation:  Normal sinus rhythm Normal ECG no significant change since June 2018 Confirmed by Pricilla Loveless [phone]) on 11/24/2017 8:48:07 PM       Radiology Dg Chest 2 View  Result Date: 11/24/2017 CLINICAL DATA:  Chest pain EXAM: CHEST  2 VIEW COMPARISON:  March 13, 2017. FINDINGS: Lungs are clear. Heart size and pulmonary vascularity are normal. No adenopathy. No pneumothorax. No bone lesions. IMPRESSION: No edema or consolidation. Electronically Signed  By: Bretta Bang III M.D.   On: 11/24/2017 21:58    Procedures Procedures (including critical care time)  Medications Ordered in ED Medications - No data to display   Initial Impression / Assessment and Plan / ED Course  I have reviewed the triage vital signs and the nursing notes.  Pertinent labs & imaging results that were available during my care of the patient were reviewed by me and considered in my medical decision making (see chart for details).    Patient is to be discharged with recommendation to follow up with PCP in regards to today's hospital visit.  His EKG was obtained and reviewed, no changes.  Chest x-ray obtained no edema, consolidations, or acute abnormalities.  At the time of evaluation his symptoms have fully resolved except from feeling tired.  He was advised to hydrate, make sure that he is eating small frequent meals, and take time to allow his body to adjust during position changes.  He has a primary care provider, and reports that he will follow up with them tomorrow.  Given age normal EKG, and full resolution of symptoms low concern for acute cardiac cause.  Wells PE criteria negative.  Vital signs are stable, regular rate and rhythm, breath sounds equal bilaterally.  He has  been advised to return to the emergency room if his symptoms recur or he has any concerns.  He is agreeable for PCP follow-up.  He agrees with  not feeling like labs are needed at this time as he is young and generally healthy.   Discharged home.     Final Clinical Impressions(s) / ED Diagnoses   Final diagnoses:  Near syncope    ED Discharge Orders    None       Norman Clay 11/25/17 0119    Pricilla Loveless, MD 11/28/17 [phone]

## 2017-11-28 DIAGNOSIS — F422 Mixed obsessional thoughts and acts: Secondary | ICD-10-CM | POA: Diagnosis not present

## 2017-11-30 DIAGNOSIS — F411 Generalized anxiety disorder: Secondary | ICD-10-CM | POA: Diagnosis not present

## 2017-11-30 DIAGNOSIS — R079 Chest pain, unspecified: Secondary | ICD-10-CM | POA: Diagnosis not present

## 2017-11-30 DIAGNOSIS — F900 Attention-deficit hyperactivity disorder, predominantly inattentive type: Secondary | ICD-10-CM | POA: Diagnosis not present

## 2017-12-28 DIAGNOSIS — F411 Generalized anxiety disorder: Secondary | ICD-10-CM | POA: Diagnosis not present

## 2017-12-28 DIAGNOSIS — R6882 Decreased libido: Secondary | ICD-10-CM | POA: Diagnosis not present

## 2018-01-20 DIAGNOSIS — F900 Attention-deficit hyperactivity disorder, predominantly inattentive type: Secondary | ICD-10-CM | POA: Diagnosis not present

## 2018-01-20 DIAGNOSIS — E291 Testicular hypofunction: Secondary | ICD-10-CM | POA: Diagnosis not present

## 2018-01-20 DIAGNOSIS — F411 Generalized anxiety disorder: Secondary | ICD-10-CM | POA: Diagnosis not present

## 2018-02-26 DIAGNOSIS — M25561 Pain in right knee: Secondary | ICD-10-CM | POA: Diagnosis not present

## 2018-02-26 DIAGNOSIS — H6123 Impacted cerumen, bilateral: Secondary | ICD-10-CM | POA: Diagnosis not present

## 2018-03-01 DIAGNOSIS — F422 Mixed obsessional thoughts and acts: Secondary | ICD-10-CM | POA: Diagnosis not present

## 2018-03-04 DIAGNOSIS — M25561 Pain in right knee: Secondary | ICD-10-CM | POA: Diagnosis not present

## 2018-03-04 DIAGNOSIS — R410 Disorientation, unspecified: Secondary | ICD-10-CM | POA: Diagnosis not present

## 2018-03-06 DIAGNOSIS — X838XXA Intentional self-harm by other specified means, initial encounter: Secondary | ICD-10-CM

## 2018-03-06 HISTORY — DX: Intentional self-harm by other specified means, initial encounter: X83.8XXA

## 2018-03-08 DIAGNOSIS — F422 Mixed obsessional thoughts and acts: Secondary | ICD-10-CM | POA: Diagnosis not present

## 2018-03-18 DIAGNOSIS — F422 Mixed obsessional thoughts and acts: Secondary | ICD-10-CM | POA: Diagnosis not present

## 2018-03-22 DIAGNOSIS — F422 Mixed obsessional thoughts and acts: Secondary | ICD-10-CM | POA: Diagnosis not present

## 2018-03-29 DIAGNOSIS — F422 Mixed obsessional thoughts and acts: Secondary | ICD-10-CM | POA: Diagnosis not present

## 2018-03-31 DIAGNOSIS — J4541 Moderate persistent asthma with (acute) exacerbation: Secondary | ICD-10-CM | POA: Diagnosis not present

## 2018-03-31 DIAGNOSIS — R062 Wheezing: Secondary | ICD-10-CM | POA: Diagnosis not present

## 2018-04-05 DIAGNOSIS — F422 Mixed obsessional thoughts and acts: Secondary | ICD-10-CM | POA: Diagnosis not present

## 2018-04-12 DIAGNOSIS — F422 Mixed obsessional thoughts and acts: Secondary | ICD-10-CM | POA: Diagnosis not present

## 2018-04-16 DIAGNOSIS — E291 Testicular hypofunction: Secondary | ICD-10-CM | POA: Diagnosis not present

## 2018-04-16 DIAGNOSIS — F419 Anxiety disorder, unspecified: Secondary | ICD-10-CM | POA: Diagnosis not present

## 2018-04-16 DIAGNOSIS — F329 Major depressive disorder, single episode, unspecified: Secondary | ICD-10-CM | POA: Diagnosis not present

## 2018-04-24 ENCOUNTER — Encounter (HOSPITAL_COMMUNITY): Payer: Self-pay | Admitting: Emergency Medicine

## 2018-04-24 ENCOUNTER — Emergency Department (HOSPITAL_COMMUNITY)
Admission: EM | Admit: 2018-04-24 | Discharge: 2018-04-24 | Disposition: A | Discharge status: Home / Self Care | Payer: BLUE CROSS/BLUE SHIELD | Attending: Emergency Medicine | Admitting: Emergency Medicine

## 2018-04-24 ENCOUNTER — Other Ambulatory Visit: Payer: Self-pay

## 2018-04-24 DIAGNOSIS — Z5321 Procedure and treatment not carried out due to patient leaving prior to being seen by health care provider: Secondary | ICD-10-CM | POA: Insufficient documentation

## 2018-04-24 DIAGNOSIS — R079 Chest pain, unspecified: Secondary | ICD-10-CM | POA: Diagnosis not present

## 2018-04-24 NOTE — ED Triage Notes (Signed)
Pt c/o R chest pain after coughing and smoking cigarette at 14:15 today. Pt states HR became elevated and numbness in both arms. Pt came to ER and pt continues to complain of tightness in chest.

## 2018-04-26 DIAGNOSIS — F422 Mixed obsessional thoughts and acts: Secondary | ICD-10-CM | POA: Diagnosis not present

## 2018-04-26 NOTE — ED Notes (Signed)
Follow up call made  Pt is going to follow up w pcp   Or return to ed if needed  04/26/18  0841  s Hosteen Kienast rn

## 2018-04-28 DIAGNOSIS — R079 Chest pain, unspecified: Secondary | ICD-10-CM | POA: Diagnosis not present

## 2018-04-28 DIAGNOSIS — R002 Palpitations: Secondary | ICD-10-CM | POA: Diagnosis not present

## 2018-04-28 DIAGNOSIS — F411 Generalized anxiety disorder: Secondary | ICD-10-CM | POA: Diagnosis not present

## 2018-04-29 DIAGNOSIS — F422 Mixed obsessional thoughts and acts: Secondary | ICD-10-CM | POA: Diagnosis not present

## 2018-05-03 DIAGNOSIS — F422 Mixed obsessional thoughts and acts: Secondary | ICD-10-CM | POA: Diagnosis not present

## 2018-05-10 DIAGNOSIS — F603 Borderline personality disorder: Secondary | ICD-10-CM | POA: Diagnosis not present

## 2018-05-10 DIAGNOSIS — F422 Mixed obsessional thoughts and acts: Secondary | ICD-10-CM | POA: Diagnosis not present

## 2018-05-10 IMAGING — CR DG CHEST 2V
2 series · 2 of 2 positions shown · non-contrast
Comparison: 08/04/2016

CLINICAL DATA: Two-day history of shortness of breath

EXAM:
CHEST  2 VIEW

[w chest pa]
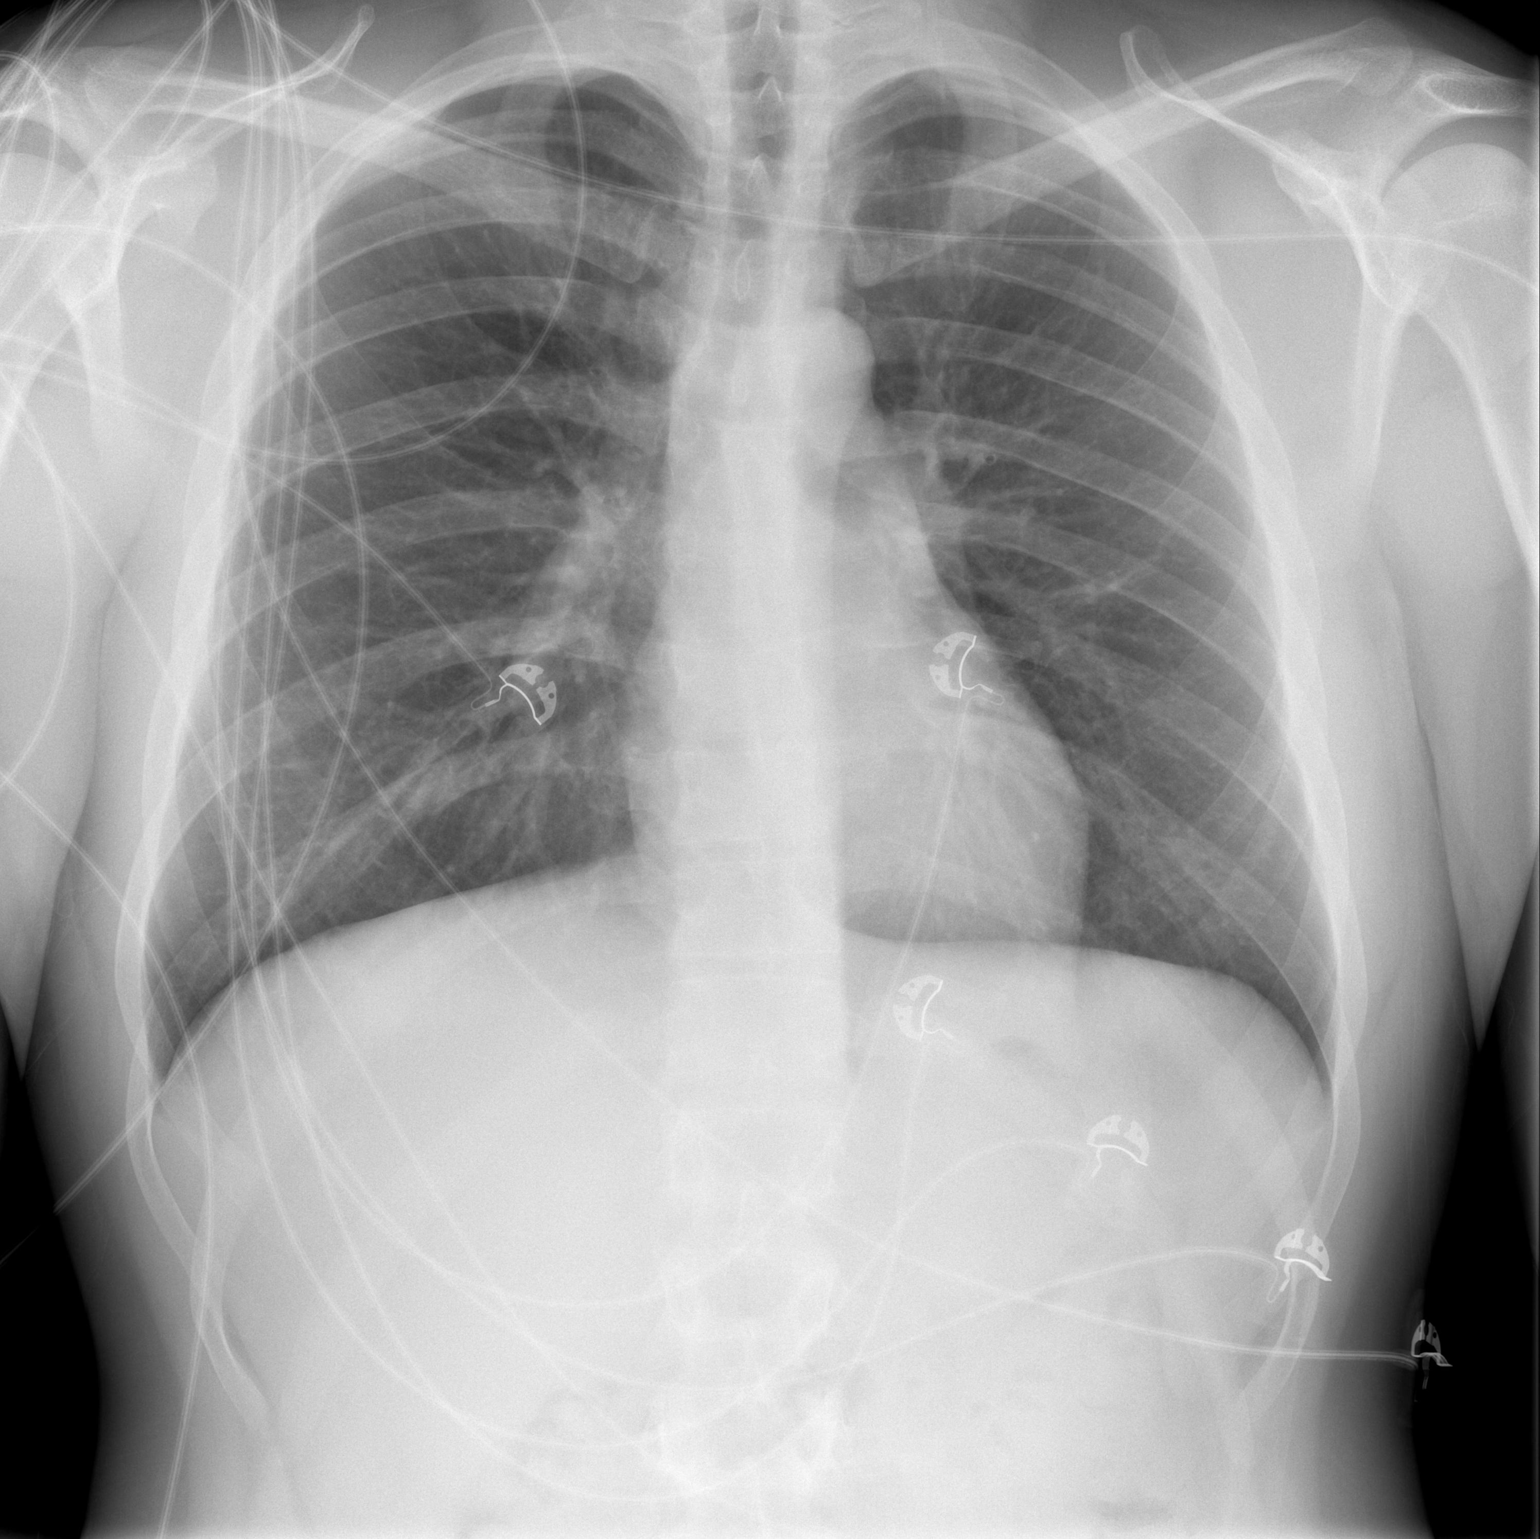

[w chest lat]
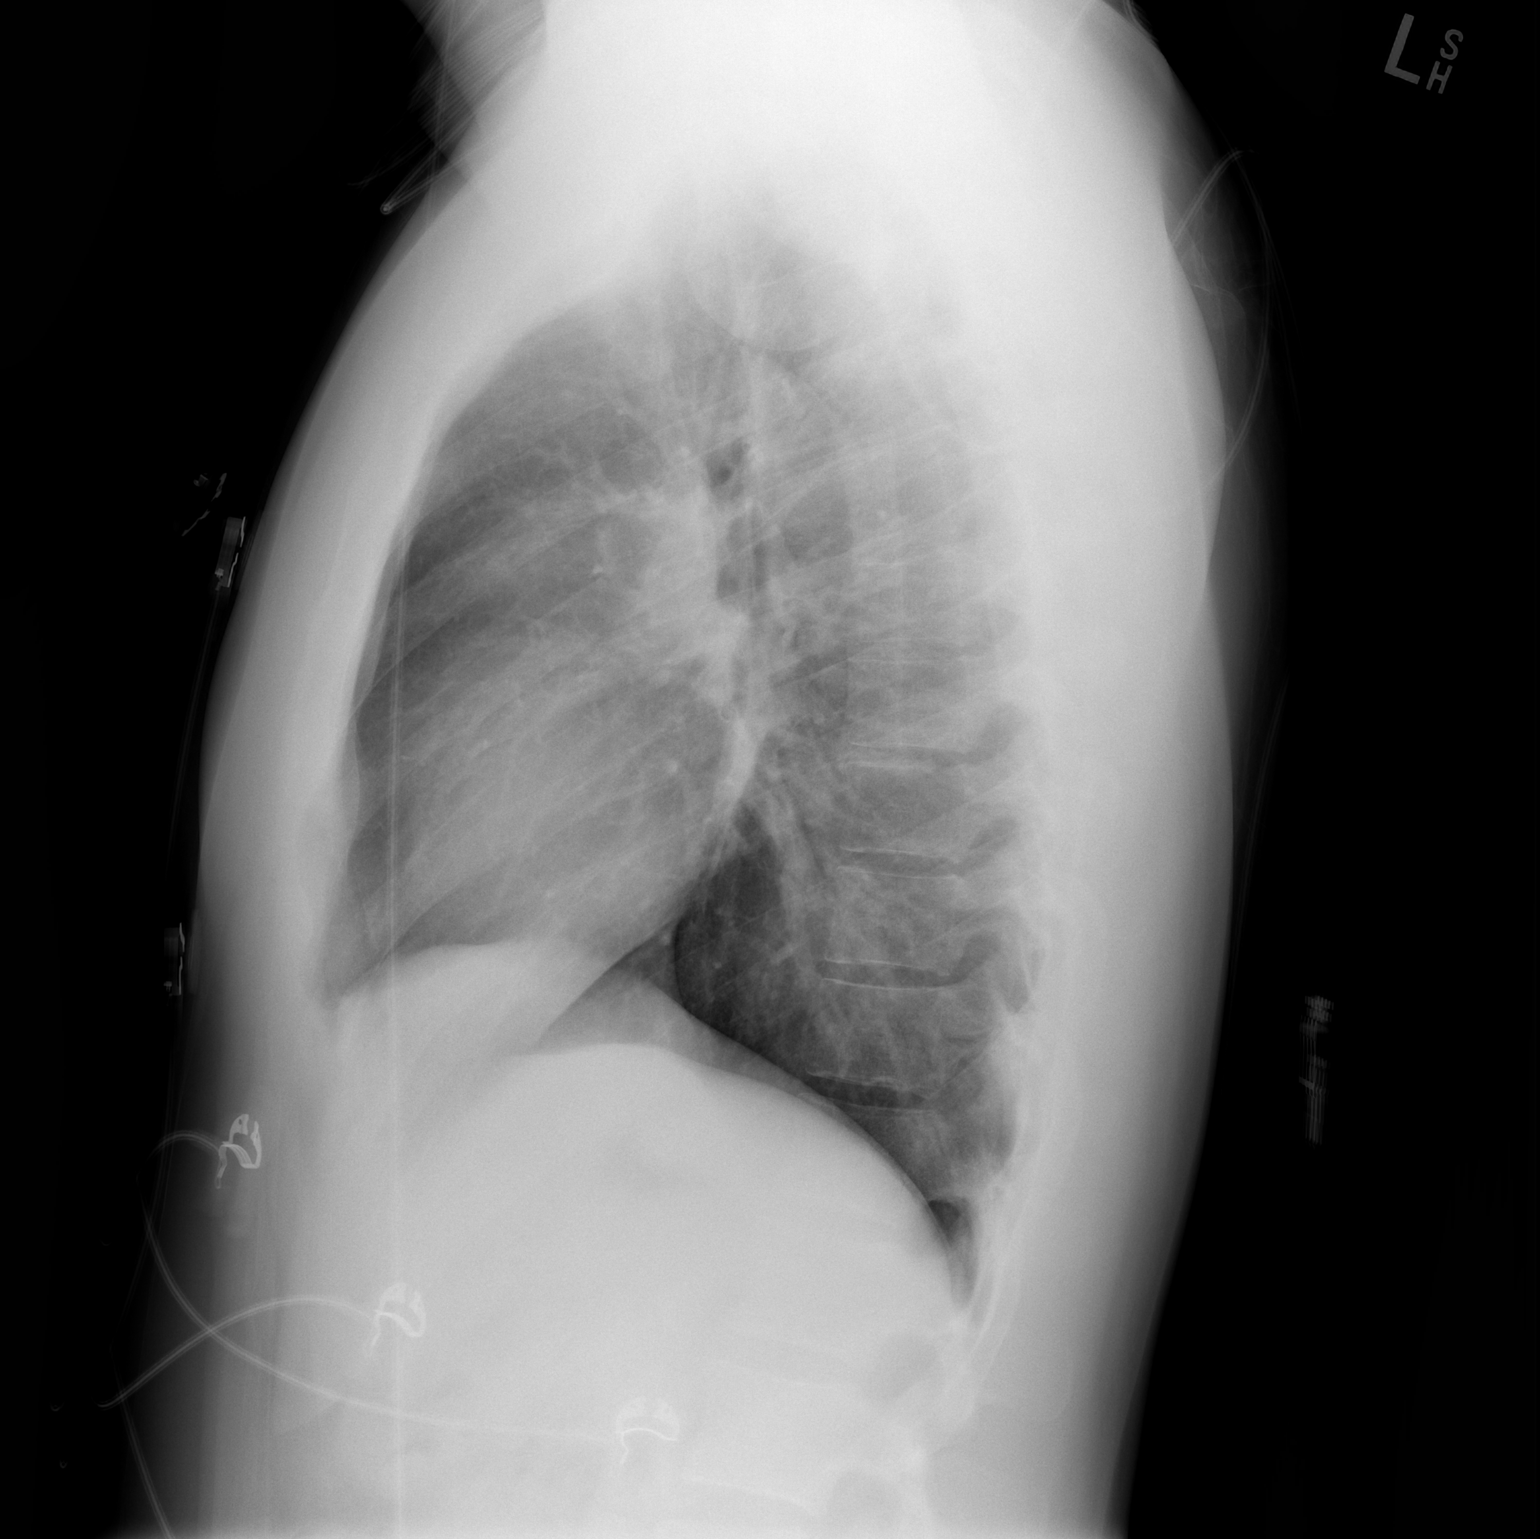

[2 of 2 positions shown; findings below may reference images not displayed]

FINDINGS: The heart size and mediastinal contours are within normal limits.
Both lungs are clear. The visualized skeletal structures are
unremarkable.
IMPRESSION: No active cardiopulmonary disease.

## 2018-05-11 NOTE — Progress Notes (Signed)
Cardiology Office Note   Date:  05/12/2018   ID:  Isaiah Stevens, DOB 07-04-1991, MRN [id number]  PCP:  Isaiah Has, MD  Cardiologist:   No primary care provider on file. Referring:  Isaiah Has, MD  Chief Complaint  Patient presents with  . Chest Pain      History of Present Illness: Isaiah Stevens is a 27 y.o. male who is referred by Isaiah Has, MD for evaluation of palpitations.  The patient was in the ED recently.   However, he did not stay for evaluation.Isaiah Stevens  He was also in the ED in Feb for near syncope.    reviewed these records for this visit.  EKG was unremarkable.   He said that the most recent episode happened after he took a few puffs on a cigarette.  He smokes a few a week.  He started coughing.  He felt that this discomfort under his chest.  It was somewhat hard to quantify or qualify.  There was some pain.  There was some sensation of an abnormal heartbeat.  His heart rate was increased and slowly went up.  He felt presyncopal.  His friend took him to the emergency room.  He had an EKG which I was able to review.  This was unremarkable.  However, he did not stay for further evaluation.  In January he had an episode of presyncope that was a little bit similar.  He does not recall the coughing.  However, he was preceded by smoking.  He did have an evaluation emergency room that time as above.  Otherwise he is very active.  He exercises routinely vigorously.  He denies any cardiovascular symptoms.  He Stevens had some panic recently.  He Stevens untreated conditions as below.  He is going to follow-up with a psychiatrist psychologist.  He took himself off of Adderall and Lexapro.   Past Medical History:  Diagnosis Date  . ADHD   . Anxiety   . Asthma   . Eczema   . OCD (obsessive compulsive disorder)   . Panic attack     History reviewed. No pertinent surgical history.   Current Outpatient Medications  Medication Sig Dispense Refill  . albuterol (PROVENTIL  HFA;VENTOLIN HFA) 108 (90 BASE) MCG/ACT inhaler Inhale 2 puffs into the lungs every 6 (six) hours as needed.    Isaiah Stevens albuterol (PROVENTIL) (2.5 MG/3ML) 0.083% nebulizer solution Take 2.5 mg by nebulization every 4 (four) hours as needed for wheezing or shortness of breath.    . amphetamine-dextroamphetamine (ADDERALL XR) 15 MG 24 hr capsule Take 15 mg by mouth every morning.    . escitalopram (LEXAPRO) 10 MG tablet Take 10 mg by mouth daily.    . Fluticasone-Salmeterol (ADVAIR) 100-50 MCG/DOSE AEPB Inhale 1 puff into the lungs every 12 (twelve) hours.    Isaiah Stevens loratadine (CLARITIN) 10 MG tablet Take 10 mg by mouth daily as needed for allergies.    Isaiah Stevens LORazepam (ATIVAN) 1 MG tablet Take 1 tablet (1 mg total) by mouth 3 (three) times daily as needed for anxiety. 10 tablet 0  . methocarbamol (ROBAXIN) 500 MG tablet Take 500-1,000 mg by mouth daily as needed for muscle spasms.    . montelukast (SINGULAIR) 10 MG tablet Take 10 mg by mouth at bedtime.     No current facility-administered medications for this visit.     Allergies:   Patient Stevens no known allergies.    Social History:  The patient  reports that he Stevens been smoking  e-cigarettes.  He Stevens never used smokeless tobacco. He reports that he drinks alcohol. He reports that he does not use drugs.   Family History:  The patient's family history includes Diabetes in his father; Heart disease (age of onset: 37) in his mother.    ROS:  Please see the history of present illness.   Otherwise, review of systems are positive for none.   All other systems are reviewed and negative.    PHYSICAL EXAM: VS:  BP 122/71   Pulse 75   Ht 6\' 1"  (1.854 m)   Wt 202 lb 6.4 oz (91.8 kg)   BMI 26.70 kg/m  , BMI Body mass index is 26.7 kg/m. GENERAL:  Well appearing HEENT:  Pupils equal round and reactive, fundi not visualized, oral mucosa unremarkable NECK:  No jugular venous distention, waveform within normal limits, carotid upstroke brisk and symmetric, no  bruits, no thyromegaly LYMPHATICS:  No cervical, inguinal adenopathy LUNGS:  Clear to auscultation bilaterally BACK:  No CVA tenderness CHEST:  Unremarkable HEART:  PMI not displaced or sustained,S1 and S2 within normal limits, no S3, no S4, no clicks, no rubs, no murmurs ABD:  Flat, positive bowel sounds normal in frequency in pitch, no bruits, no rebound, no guarding, no midline pulsatile mass, no hepatomegaly, no splenomegaly EXT:  2 plus pulses throughout, no edema, no cyanosis no clubbing SKIN:  No rashes no nodules NEURO:  Cranial nerves II through XII grossly intact, motor grossly intact throughout PSYCH:  Cognitively intact, oriented to person place and time    EKG:  EKG is not ordered today. The ekg ordered 04/24/18 demonstrates 100, axis within normal limits, intervals within normal limits, no acute ST-T wave changes.  [place] rhythm, rate   Recent Labs: No results found for requested labs within last 8760 hours.    Lipid Panel No results found for: CHOL, TRIG, HDL, CHOLHDL, VLDL, LDLCALC, LDLDIRECT    Wt Readings from Last 3 Encounters:  05/12/18 202 lb 6.4 oz (91.8 kg)  11/24/17 214 lb 8.1 oz (97.3 kg)  10/26/17 221 lb (100.2 kg)      Other studies Reviewed: Additional studies/ records that were reviewed today include: ED records. Review of the above records demonstrates:  Please see elsewhere in the note.    ASSESSMENT AND PLAN:  NEAR SYNCOPE:    The patient had near syncope and episodes of palpitations with some atypical chest pain.  Some of this was associated with coughing.  At this point I do not think that further ongoing testing would be indicated.  I would like him to get a Apple Watch.  I think this would be the best way to monitor his infrequent symptoms.  I do note to be a normal TSH and otherwise unremarkable labs.  We had a long discussion about treatment of anxiety and getting this managed.  I suggested perhaps switching albuterol.  However, I do not think  that further cardiovascular testing is suggested as a suspect a structurally normal heart and he Stevens no significant risk factors.  However, he would come back to see me or send me rhythm strips if he Stevens any increasing symptoms.  Again he needs lifestyle modification.   Current medicines are reviewed at length with the patient today.  The patient does not have concerns regarding medicines.  The following changes have been made:  no change  Labs/ tests ordered today include: None No orders of the defined types were placed in this encounter.  Disposition:   FU with me as needed.      Signed, Rollene RotundaJames Dimitrious Micciche, MD  05/12/2018 11:00 AM    [facility]

## 2018-05-12 ENCOUNTER — Ambulatory Visit: Payer: BLUE CROSS/BLUE SHIELD | Admitting: Diagnostic Neuroimaging

## 2018-05-12 ENCOUNTER — Encounter: Payer: Self-pay | Admitting: Cardiology

## 2018-05-12 ENCOUNTER — Ambulatory Visit (INDEPENDENT_AMBULATORY_CARE_PROVIDER_SITE_OTHER): Payer: BLUE CROSS/BLUE SHIELD | Admitting: Cardiology

## 2018-05-12 ENCOUNTER — Encounter

## 2018-05-12 VITALS — BP 122/71 | HR 75 | Ht 73.0 in | Wt 202.4 lb

## 2018-05-12 DIAGNOSIS — R079 Chest pain, unspecified: Secondary | ICD-10-CM | POA: Diagnosis not present

## 2018-05-12 DIAGNOSIS — R002 Palpitations: Secondary | ICD-10-CM | POA: Diagnosis not present

## 2018-05-12 NOTE — Patient Instructions (Signed)
Medication Instructions:  Your physician recommends that you continue on your current medications as directed. Please refer to the Current Medication list given to you today.   Labwork: none  Testing/Procedures: none  Follow-Up: Follow up with Dr. Hochrein as needed.   Any Other Special Instructions Will Be Listed Below (If Applicable).     If you need a refill on your cardiac medications before your next appointment, please call your pharmacy.   

## 2018-05-17 ENCOUNTER — Ambulatory Visit (INDEPENDENT_AMBULATORY_CARE_PROVIDER_SITE_OTHER): Payer: BLUE CROSS/BLUE SHIELD | Admitting: Diagnostic Neuroimaging

## 2018-05-17 ENCOUNTER — Encounter

## 2018-05-17 ENCOUNTER — Encounter: Payer: Self-pay | Admitting: Diagnostic Neuroimaging

## 2018-05-17 VITALS — BP 111/68 | HR 78 | Ht 73.0 in

## 2018-05-17 DIAGNOSIS — F422 Mixed obsessional thoughts and acts: Secondary | ICD-10-CM | POA: Diagnosis not present

## 2018-05-17 DIAGNOSIS — R413 Other amnesia: Secondary | ICD-10-CM | POA: Diagnosis not present

## 2018-05-17 DIAGNOSIS — R7989 Other specified abnormal findings of blood chemistry: Secondary | ICD-10-CM

## 2018-05-17 NOTE — Progress Notes (Signed)
[facility]  PATIENT: Isaiah FlurryJoshua Stevens DOB: 07/26/1991  REFERRING CLINICIAN: A Morrow HISTORY FROM: patient  REASON FOR VISIT: new consult    HISTORICAL  CHIEF COMPLAINT:  Chief Complaint  Patient presents with  . Mental confusion    rm 6, "short term memory loss in past 2 years"    HISTORY OF PRESENT ILLNESS:   27 year old male here for evaluation of memory loss.  For past 2 years patient has been having increasing short-term memory loss, confusion, mental fog, mood swings.  Patient also has history of anxiety disorder and is currently under treatment.  Patient was diagnosed with low testosterone and is currently on medication by urologist.  Patient averaging 7 hours of sleep but is not restful.  Review of systems notable for depression, anxiety, not enough sleep, decreased energy, suicidal thoughts, racing thoughts.  Patient is able to contract for safety.  Also with significant fatigue, palpitations, blurred vision, wheezing and sleepiness.  Patient stays active physically with exercise 6 to 8 hours/week.   REVIEW OF SYSTEMS: Full 14 system review of systems performed and negative with exception of: As per HPI.  ALLERGIES: No Known Allergies  HOME MEDICATIONS: Outpatient Medications Prior to Visit  Medication Sig Dispense Refill  . albuterol (PROVENTIL HFA;VENTOLIN HFA) 108 (90 BASE) MCG/ACT inhaler Inhale 2 puffs into the lungs every 6 (six) hours as needed.    [name]. albuterol (PROVENTIL) (2.5 MG/3ML) 0.083% nebulizer solution Take 2.5 mg by nebulization every 4 (four) hours as needed for wheezing or shortness of breath.    . ALPRAZolam (XANAX) 0.5 MG tablet Take 0.5 mg by mouth as needed for anxiety.    [name]. amphetamine-dextroamphetamine (ADDERALL XR) 15 MG 24 hr capsule Take 15 mg by mouth every morning.    . clomiPHENE (CLOMID) 50 MG tablet Take by mouth daily.    . Fluticasone-Salmeterol (ADVAIR) 100-50 MCG/DOSE AEPB Inhale 1 puff into the lungs every  12 (twelve) hours.    [name]. loratadine (CLARITIN) 10 MG tablet Take 10 mg by mouth daily as needed for allergies.    . montelukast (SINGULAIR) 10 MG tablet Take 10 mg by mouth at bedtime.    [name]. escitalopram (LEXAPRO) 10 MG tablet Take 10 mg by mouth daily.    . methocarbamol (ROBAXIN) 500 MG tablet Take 500-1,000 mg by mouth daily as needed for muscle spasms.    [name]. LORazepam (ATIVAN) 1 MG tablet Take 1 tablet (1 mg total) by mouth 3 (three) times daily as needed for anxiety. (Patient not taking: Reported on 05/17/2018) 10 tablet 0   No facility-administered medications prior to visit.     PAST MEDICAL HISTORY: Past Medical History:  Diagnosis Date  . ADHD   . Anxiety   . Asthma   . Eczema   . OCD (obsessive compulsive disorder)   . Panic attack   . Suicide (HCC) 03/2018   "suicide attempt"    PAST SURGICAL HISTORY: No past surgical history on file.  FAMILY HISTORY: Family History  Problem Relation Age of Onset  . Heart disease Mother 6740       Pacemaker, history of syncope  . Diabetes Father        Type II    SOCIAL HISTORY: Social History   Socioeconomic History  . Marital status: Single    Spouse name: Not on file  . Number of children: Not on file  . Years of education: Not on file  . Highest education level: Not on file  Occupational History  .  Not on file  Social Needs  . Financial resource strain: Not on file  . Food insecurity:    Worry: Not on file    Inability: Not on file  . Transportation needs:    Medical: Not on file    Non-medical: Not on file  Tobacco Use  . Smoking status: Current Some Day Smoker    Types: E-cigarettes  . Smokeless tobacco: Never Used  Substance and Sexual Activity  . Alcohol use: Yes    Comment: occasional   . Drug use: No  . Sexual activity: Not on file  Lifestyle  . Physical activity:    Days per week: Not on file    Minutes per session: Not on file  . Stress: Not on file  Relationships  . Social connections:    Talks  on phone: Not on file    Gets together: Not on file    Attends religious service: Not on file    Active member of club or organization: Not on file    Attends meetings of clubs or organizations: Not on file    Relationship status: Not on file  . Intimate partner violence:    Fear of current or ex partner: Not on file    Emotionally abused: Not on file    Physically abused: Not on file    Forced sexual activity: Not on file  Other Topics Concern  . Not on file  Social History Narrative   Caffeine 2 cups coffee a week. Works Lab [employer]     PHYSICAL EXAM  GENERAL EXAM/CONSTITUTIONAL: Vitals:  Vitals:   05/17/18 1429  BP: 111/68  Pulse: 78  Height: 6\' 1"  (1.854 m)     Body mass index is 26.7 kg/m. Wt Readings from Last 3 Encounters:  05/12/18 202 lb 6.4 oz (91.8 kg)  11/24/17 214 lb 8.1 oz (97.3 kg)  10/26/17 221 lb (100.2 kg)     Patient is in no distress; well developed, nourished and groomed; neck is supple  CARDIOVASCULAR:  Examination of carotid arteries is normal; no carotid bruits  Regular rate and rhythm, no murmurs  Examination of peripheral vascular system by observation and palpation is normal  EYES:  Ophthalmoscopic exam of optic discs and posterior segments is normal; no papilledema or hemorrhages  No exam data present  MUSCULOSKELETAL:  Gait, strength, tone, movements noted in Neurologic exam below  NEUROLOGIC: MENTAL STATUS:  No flowsheet data found.  awake, alert, oriented to person, place and time  recent and remote memory intact  normal attention and concentration  language fluent, comprehension intact, naming intact  fund of knowledge appropriate  CRANIAL NERVE:   2nd - no papilledema on fundoscopic exam  2nd, 3rd, 4th, 6th - pupils equal and reactive to light, visual fields full to confrontation, extraocular muscles intact, no nystagmus  5th - facial sensation symmetric  7th - facial strength symmetric  8th - hearing  intact  9th - palate elevates symmetrically, uvula midline  11th - shoulder shrug symmetric  12th - tongue protrusion midline  MOTOR:   normal bulk and tone, full strength in the BUE, BLE  SENSORY:   normal and symmetric to light touch  COORDINATION:   finger-nose-finger, fine finger movements normal  REFLEXES:   deep tendon reflexes present and symmetric  GAIT/STATION:   narrow based gait     DIAGNOSTIC DATA (LABS, IMAGING, TESTING) - I reviewed patient records, labs, notes, testing and imaging myself where available.  No results found for: WBC,  HGB, HCT, MCV, PLT No results found for: NA, K, CL, CO2, GLUCOSE, BUN, CREATININE, CALCIUM, PROT, ALBUMIN, AST, ALT, ALKPHOS, BILITOT, GFRNONAA, GFRAA No results found for: CHOL, HDL, LDLCALC, LDLDIRECT, TRIG, CHOLHDL No results found for: ZHYQ6V No results found for: VITAMINB12 No results found for: TSH      ASSESSMENT AND PLAN  27 y.o. year old male here with 2 years of confusion, memory loss, fatigue, depression anxiety and mental fog.  Also found to have low testosterone levels currently on medical therapy.  We will proceed with further work-up.  Dx:  1. Memory loss   2. Low testosterone     PLAN:  - check MRI brain and pituitary (with and without)  - check EEG (confusion; spacing out)  - follow up with psychiatry   - consider endocrinology consult (low testosterone)  Orders Placed This Encounter  Procedures  . MR BRAIN W WO CONTRAST   Return pending test results.    Suanne Marker, MD 05/17/2018, 2:47 PM Certified in Neurology, Neurophysiology and Neuroimaging  [facility] [address] [place], [place] [address] [phone]

## 2018-05-18 DIAGNOSIS — F411 Generalized anxiety disorder: Secondary | ICD-10-CM | POA: Diagnosis not present

## 2018-05-19 ENCOUNTER — Telehealth: Payer: Self-pay | Admitting: Diagnostic Neuroimaging

## 2018-05-19 NOTE — Telephone Encounter (Signed)
[employer] [place] order sent to [facility]. They will reach out to the pt to schedule.

## 2018-05-24 DIAGNOSIS — F422 Mixed obsessional thoughts and acts: Secondary | ICD-10-CM | POA: Diagnosis not present

## 2018-05-25 ENCOUNTER — Ambulatory Visit (INDEPENDENT_AMBULATORY_CARE_PROVIDER_SITE_OTHER): Payer: BLUE CROSS/BLUE SHIELD | Admitting: Diagnostic Neuroimaging

## 2018-05-25 DIAGNOSIS — R413 Other amnesia: Secondary | ICD-10-CM

## 2018-05-25 DIAGNOSIS — R410 Disorientation, unspecified: Secondary | ICD-10-CM | POA: Diagnosis not present

## 2018-05-31 DIAGNOSIS — F422 Mixed obsessional thoughts and acts: Secondary | ICD-10-CM | POA: Diagnosis not present

## 2018-05-31 DIAGNOSIS — E291 Testicular hypofunction: Secondary | ICD-10-CM | POA: Diagnosis not present

## 2018-05-31 NOTE — Procedures (Signed)
   [facility]  EEG (ELECTROENCEPHALOGRAM) REPORT   STUDY DATE: 05/25/18 PATIENT NAME: Isaiah FlurryJoshua Zeiss DOB: [birth year] MRN: [id number]  ORDERING CLINICIAN: Joycelyn SchmidVikram Penumalli, MD   TECHNOLOGIST: Charlett BlakeN Willard  TECHNIQUE: Electroencephalogram was recorded utilizing standard 10-20 system of lead placement and reformatted into average and bipolar montages.  RECORDING TIME: 21 minutes  ACTIVATION: hyperventilation and photic stimulation  CLINICAL INFORMATION: 27 year old male with memory loss and confusion.  FINDINGS: Posterior dominant background rhythms, which attenuate with eye opening, ranging 9-10 hertz and 50-60 microvolts. No focal, lateralizing, epileptiform activity or seizures are seen. Patient recorded in the awake and drowsy state. EKG channel shows regular rhythm of 70-75 beats per minute.   IMPRESSION:   Normal EEG in the awake and drowsy states.     INTERPRETING PHYSICIAN:  Suanne MarkerVIKRAM R. PENUMALLI, MD Certified in Neurology, Neurophysiology and Neuroimaging  [facility] [address], [address] [place], [place] [address] [phone]

## 2018-06-02 DIAGNOSIS — F603 Borderline personality disorder: Secondary | ICD-10-CM | POA: Diagnosis not present

## 2018-06-04 ENCOUNTER — Telehealth: Payer: Self-pay | Admitting: *Deleted

## 2018-06-04 NOTE — Telephone Encounter (Signed)
Attempted to reach patient re: normal EEG. No answer, no voice message came on. Will try later.

## 2018-06-04 NOTE — Telephone Encounter (Signed)
Pt returning RNs call advised EEG was normal and reminded pt of upcoming MRI.

## 2018-06-07 DIAGNOSIS — F422 Mixed obsessional thoughts and acts: Secondary | ICD-10-CM | POA: Diagnosis not present

## 2018-06-07 DIAGNOSIS — F603 Borderline personality disorder: Secondary | ICD-10-CM | POA: Diagnosis not present

## 2018-06-08 DIAGNOSIS — F411 Generalized anxiety disorder: Secondary | ICD-10-CM | POA: Diagnosis not present

## 2018-06-08 DIAGNOSIS — F410 Panic disorder [episodic paroxysmal anxiety] without agoraphobia: Secondary | ICD-10-CM | POA: Diagnosis not present

## 2018-06-14 DIAGNOSIS — F422 Mixed obsessional thoughts and acts: Secondary | ICD-10-CM | POA: Diagnosis not present

## 2018-06-15 ENCOUNTER — Ambulatory Visit
Admission: RE | Admit: 2018-06-15 | Discharge: 2018-06-15 | Disposition: A | Discharge status: Home / Self Care | Payer: BLUE CROSS/BLUE SHIELD | Source: Ambulatory Visit | Attending: Diagnostic Neuroimaging | Admitting: Diagnostic Neuroimaging

## 2018-06-15 DIAGNOSIS — R413 Other amnesia: Secondary | ICD-10-CM

## 2018-06-15 MED ORDER — GADOBENATE DIMEGLUMINE 529 MG/ML IV SOLN
10.0000 mL | Freq: Once | INTRAVENOUS | Status: AC | PRN
  Administered 2018-06-15: 10 mL via INTRAVENOUS

## 2018-06-21 DIAGNOSIS — F422 Mixed obsessional thoughts and acts: Secondary | ICD-10-CM | POA: Diagnosis not present

## 2018-06-22 DIAGNOSIS — Z79891 Long term (current) use of opiate analgesic: Secondary | ICD-10-CM | POA: Diagnosis not present

## 2018-06-22 DIAGNOSIS — F411 Generalized anxiety disorder: Secondary | ICD-10-CM | POA: Diagnosis not present

## 2018-06-22 DIAGNOSIS — F410 Panic disorder [episodic paroxysmal anxiety] without agoraphobia: Secondary | ICD-10-CM | POA: Diagnosis not present

## 2018-06-22 DIAGNOSIS — F429 Obsessive-compulsive disorder, unspecified: Secondary | ICD-10-CM | POA: Diagnosis not present

## 2018-06-28 DIAGNOSIS — F422 Mixed obsessional thoughts and acts: Secondary | ICD-10-CM | POA: Diagnosis not present

## 2018-07-08 ENCOUNTER — Telehealth: Payer: Self-pay | Admitting: *Deleted

## 2018-07-08 DIAGNOSIS — F422 Mixed obsessional thoughts and acts: Secondary | ICD-10-CM | POA: Diagnosis not present

## 2018-07-08 NOTE — Telephone Encounter (Signed)
Spoke with patient and informed him his MRI brain showed unremarkable imaging results. Advised there is one non-specific scar tissue spot noted. Advised him Dr Marjory Lies may consider repeat MRI in 6-12 months to ensure stability.  He asked what the spot of scar tissue means. This RN advised it is non specific meaning no diagnosis made from it; it could be from headaches, migraines, BP or other medical causes.  Advised him Dr Marjory Lies will continue with his current plan. Patient stated he has been seeing a psychiatrist. He had no further questions, verbalized understanding, appreciation of call.

## 2018-07-12 DIAGNOSIS — F422 Mixed obsessional thoughts and acts: Secondary | ICD-10-CM | POA: Diagnosis not present

## 2018-07-16 DIAGNOSIS — M9902 Segmental and somatic dysfunction of thoracic region: Secondary | ICD-10-CM | POA: Diagnosis not present

## 2018-07-16 DIAGNOSIS — M9901 Segmental and somatic dysfunction of cervical region: Secondary | ICD-10-CM | POA: Diagnosis not present

## 2018-07-16 DIAGNOSIS — M9903 Segmental and somatic dysfunction of lumbar region: Secondary | ICD-10-CM | POA: Diagnosis not present

## 2018-07-16 DIAGNOSIS — M9905 Segmental and somatic dysfunction of pelvic region: Secondary | ICD-10-CM | POA: Diagnosis not present

## 2018-07-19 DIAGNOSIS — F422 Mixed obsessional thoughts and acts: Secondary | ICD-10-CM | POA: Diagnosis not present

## 2018-07-30 DIAGNOSIS — M9903 Segmental and somatic dysfunction of lumbar region: Secondary | ICD-10-CM | POA: Diagnosis not present

## 2018-07-30 DIAGNOSIS — M9905 Segmental and somatic dysfunction of pelvic region: Secondary | ICD-10-CM | POA: Diagnosis not present

## 2018-07-30 DIAGNOSIS — M9902 Segmental and somatic dysfunction of thoracic region: Secondary | ICD-10-CM | POA: Diagnosis not present

## 2018-07-30 DIAGNOSIS — M9901 Segmental and somatic dysfunction of cervical region: Secondary | ICD-10-CM | POA: Diagnosis not present

## 2018-08-02 DIAGNOSIS — F422 Mixed obsessional thoughts and acts: Secondary | ICD-10-CM | POA: Diagnosis not present

## 2018-08-09 DIAGNOSIS — F422 Mixed obsessional thoughts and acts: Secondary | ICD-10-CM | POA: Diagnosis not present

## 2018-08-23 DIAGNOSIS — F422 Mixed obsessional thoughts and acts: Secondary | ICD-10-CM | POA: Diagnosis not present

## 2018-09-06 DIAGNOSIS — F422 Mixed obsessional thoughts and acts: Secondary | ICD-10-CM | POA: Diagnosis not present

## 2018-09-20 DIAGNOSIS — F422 Mixed obsessional thoughts and acts: Secondary | ICD-10-CM | POA: Diagnosis not present

## 2018-10-29 DIAGNOSIS — F418 Other specified anxiety disorders: Secondary | ICD-10-CM | POA: Diagnosis not present

## 2018-11-02 DIAGNOSIS — R05 Cough: Secondary | ICD-10-CM | POA: Diagnosis not present

## 2018-11-29 DIAGNOSIS — F418 Other specified anxiety disorders: Secondary | ICD-10-CM | POA: Diagnosis not present

## 2019-01-19 ENCOUNTER — Encounter (HOSPITAL_COMMUNITY): Payer: Self-pay

## 2019-01-19 ENCOUNTER — Other Ambulatory Visit: Payer: Self-pay

## 2019-01-19 ENCOUNTER — Emergency Department (HOSPITAL_COMMUNITY): Payer: BLUE CROSS/BLUE SHIELD

## 2019-01-19 ENCOUNTER — Emergency Department (HOSPITAL_COMMUNITY)
Admission: EM | Admit: 2019-01-19 | Discharge: 2019-01-19 | Disposition: A | Discharge status: Home / Self Care | Payer: BLUE CROSS/BLUE SHIELD | Attending: Emergency Medicine | Admitting: Emergency Medicine

## 2019-01-19 DIAGNOSIS — Y929 Unspecified place or not applicable: Secondary | ICD-10-CM | POA: Diagnosis not present

## 2019-01-19 DIAGNOSIS — J45909 Unspecified asthma, uncomplicated: Secondary | ICD-10-CM | POA: Diagnosis not present

## 2019-01-19 DIAGNOSIS — Y999 Unspecified external cause status: Secondary | ICD-10-CM | POA: Insufficient documentation

## 2019-01-19 DIAGNOSIS — S42022A Displaced fracture of shaft of left clavicle, initial encounter for closed fracture: Secondary | ICD-10-CM | POA: Diagnosis not present

## 2019-01-19 DIAGNOSIS — F17290 Nicotine dependence, other tobacco product, uncomplicated: Secondary | ICD-10-CM | POA: Diagnosis not present

## 2019-01-19 DIAGNOSIS — Z79899 Other long term (current) drug therapy: Secondary | ICD-10-CM | POA: Insufficient documentation

## 2019-01-19 DIAGNOSIS — S4992XA Unspecified injury of left shoulder and upper arm, initial encounter: Secondary | ICD-10-CM | POA: Diagnosis present

## 2019-01-19 DIAGNOSIS — Y9351 Activity, roller skating (inline) and skateboarding: Secondary | ICD-10-CM | POA: Insufficient documentation

## 2019-01-19 MED ORDER — OXYCODONE-ACETAMINOPHEN 5-325 MG PO TABS
1.0000 | ORAL_TABLET | ORAL | Status: DC | PRN
  Administered 2019-01-19: 1 via ORAL
  Filled 2019-01-19: qty 1

## 2019-01-19 MED ORDER — OXYCODONE-ACETAMINOPHEN 5-325 MG PO TABS
1.0000 | ORAL_TABLET | Freq: Once | ORAL | Status: AC
  Administered 2019-01-19: 1 via ORAL
  Filled 2019-01-19: qty 1

## 2019-01-19 MED ORDER — OXYCODONE-ACETAMINOPHEN 5-325 MG PO TABS: 1.0000 | ORAL_TABLET | Freq: Four times a day (QID) | ORAL | 0 refills | Status: DC | PRN

## 2019-01-19 NOTE — ED Provider Notes (Signed)
[facility] EMERGENCY DEPARTMENT Provider Note   CSN: [id number] Arrival date & time: 01/19/19  1750    History   Chief Complaint Chief Complaint  Patient presents with  . Fall    HPI Isaiah Stevens is a 28 y.o. male with a hx of tobacco abuse, asthma, anxiety, and OCD who presents to the ED s/p mechanical fall shortly PTA with complaints of pain to the left clavicle.  Patient states that he was on his skateboard, lost his balance, and fell onto the L shoulder/clavicle. Denies head injury or LOC. Able to get up and ambulate without assistance. States he feels he has an isolated injury to L clavicle, no pain elsewhere, current pain is moderate, improved w/ percocet given in triage, worse with movement. Denies headache, neck pain, back pain, abdominal pain, dyspnea, numbness, or weakness. R hand dominant. No prodromal sxs prior to fall.     HPI  Past Medical History:  Diagnosis Date  . ADHD   . Anxiety   . Asthma   . Eczema   . OCD (obsessive compulsive disorder)   . Panic attack   . Suicide (HCC) 03/2018   "suicide attempt"    Patient Active Problem List   Diagnosis Date Noted  . Hemoptysis 09/10/2016  . ADHD 09/06/2009  . ALLERGIC RHINITIS 09/06/2009  . ASTHMA 09/06/2009    History reviewed. No pertinent surgical history.      Home Medications    Prior to Admission medications   Medication Sig Start Date End Date Taking? Authorizing Provider  albuterol (PROVENTIL HFA;VENTOLIN HFA) 108 (90 BASE) MCG/ACT inhaler Inhale 2 puffs into the lungs every 6 (six) hours as needed.    [provider]  albuterol (PROVENTIL) (2.5 MG/3ML) 0.083% nebulizer solution Take 2.5 mg by nebulization every 4 (four) hours as needed for wheezing or shortness of breath.    [provider]  ALPRAZolam Prudy Feeler) 0.5 MG tablet Take 0.5 mg by mouth as needed for anxiety.    [provider]  amphetamine-dextroamphetamine (ADDERALL XR) 15 MG 24 hr  capsule Take 15 mg by mouth every morning.    [provider]  clomiPHENE (CLOMID) 50 MG tablet Take by mouth daily.    [provider]  escitalopram (LEXAPRO) 10 MG tablet Take 10 mg by mouth daily.    [provider]  Fluticasone-Salmeterol (ADVAIR) 100-50 MCG/DOSE AEPB Inhale 1 puff into the lungs every 12 (twelve) hours.    [provider]  loratadine (CLARITIN) 10 MG tablet Take 10 mg by mouth daily as needed for allergies.    [provider]  methocarbamol (ROBAXIN) 500 MG tablet Take 500-1,000 mg by mouth daily as needed for muscle spasms.    [provider]  montelukast (SINGULAIR) 10 MG tablet Take 10 mg by mouth at bedtime.    [provider]    Family History Family History  Problem Relation Age of Onset  . Heart disease Mother 32       Pacemaker, history of syncope  . Diabetes Father        Type II    Social History Social History   Tobacco Use  . Smoking status: Current Some Day Smoker    Types: E-cigarettes  . Smokeless tobacco: Never Used  Substance Use Topics  . Alcohol use: Yes    Comment: occasional   . Drug use: No     Allergies   Patient has no known allergies.   Review of Systems Review of  Systems  Constitutional: Negative for chills and fever.  Respiratory: Negative for shortness of breath.   Gastrointestinal: Negative for nausea and vomiting.  Musculoskeletal: Positive for arthralgias.  Neurological: Negative for dizziness, tremors, syncope, weakness, numbness and headaches.     Physical Exam Updated Vital Signs BP 135/85 (BP Location: Right Arm)   Pulse 92   Temp 97.7 F (36.5 C) (Oral)   Resp 20   Ht  (1.854 m)   Wt 90.7 kg   SpO2 98%   BMI 26.39 kg/m   Physical Exam Vitals signs and nursing note reviewed.  Constitutional:      General: He is not in acute distress.    Appearance: Normal appearance. He is well-developed. He is not ill-appearing or toxic-appearing.   HENT:     Head: Normocephalic and atraumatic. No raccoon eyes or Battle's sign.     Right Ear: No hemotympanum.     Left Ear: No hemotympanum.     Mouth/Throat:     Pharynx: Uvula midline.  Eyes:     General:        Right eye: No discharge.        Left eye: No discharge.     Conjunctiva/sclera: Conjunctivae normal.  Neck:     Musculoskeletal: Normal range of motion and neck supple. No spinous process tenderness.     Comments: No midline tenderness.  Cardiovascular:     Rate and Rhythm: Normal rate and regular rhythm.     Pulses:          Radial pulses are 2+ on the right side and 2+ on the left side.  Pulmonary:     Effort: Pulmonary effort is normal. No respiratory distress.     Breath sounds: Normal breath sounds. No wheezing, rhonchi or rales.  Chest:       Comments: Other than L mid/distal clavicle otherwise nontender chest wall.  Abdominal:     General: There is no distension.     Palpations: Abdomen is soft.     Tenderness: There is no abdominal tenderness.  Musculoskeletal:     Comments: Upper extremities: No obvious deformity, appreciable swelling, edema, erythema, ecchymosis, warmth, or open wounds. Patient has intact AROM throughout with the exception of L shoulder limited secondary to pain in all directions. UEs are nontender.  Back: no midline tenderness or palpable step off Lower extremities: Normal AROM throughout. Nontender to palpation.   Skin:    General: Skin is warm and dry.     Capillary Refill: Capillary refill takes less than 2 seconds.     Findings: No rash.  Neurological:     Mental Status: He is alert.     Comments: Alert. Clear speech.  CN III through XII grossly intact.  Sensation grossly intact to bilateral upper extremities. 5/5 symmetric grip strength.  Able to perform okay sign, thumbs up, and cross second/third digits.  Ambulatory.   Psychiatric:        Mood and Affect: Mood normal.        Behavior: Behavior normal.      ED Treatments /  Results  Labs (all labs ordered are listed, but only abnormal results are displayed) Labs Reviewed - No data to display  EKG None  Radiology Dg Clavicle Left  Result Date: 01/19/2019 CLINICAL DATA:  Left shoulder pain EXAM: LEFT CLAVICLE - 2+ VIEWS COMPARISON:  None. FINDINGS: Comminuted left mid-distal clavicular fracture with 1 shaft with inferior displacement. No other fracture or dislocation. Normal acromioclavicular joint. IMPRESSION:  Comminuted left mid-distal clavicular fracture with 1 shaft with inferior displacement. Electronically Signed   By: Elige KoHetal  Patel   On: 01/19/2019 19:04   Dg Shoulder Left  Result Date: 01/19/2019 CLINICAL DATA:  Left shoulder pain EXAM: LEFT SHOULDER - 2+ VIEW COMPARISON:  None. FINDINGS: Comminuted left mid-distal clavicular fracture with 1 shaft with inferior displacement. No other fracture or dislocation.  No aggressive osseous lesion. IMPRESSION: 1. Comminuted left mid-distal clavicular fracture with 1 shaft with inferior displacement. Electronically Signed   By: Elige KoHetal  Patel   On: 01/19/2019 19:04    Procedures Procedures (including critical care time) SPLINT APPLICATION Date/Time: 8:06 PM Authorized by: Harvie Heck  Consent: Verbal consent obtained. Risks and benefits: risks, benefits and alternatives were discussed Consent given by: patient Splint applied by: Nursing staff Location details: LUE Splint type: sling Supplies used: sling Post-procedure: The splinted body part was neurovascularly unchanged following the procedure. Patient tolerance: Patient tolerated the procedure well with no immediate complications.    Medications Ordered in ED Medications  oxyCODONE-acetaminophen (PERCOCET/ROXICET) 5-325 MG per tablet 1 tablet (1 tablet Oral Given 01/19/19 1831)     Initial Impression / Assessment and Plan / ED Course  I have reviewed the triage vital signs and the nursing notes.  Pertinent labs & imaging results that were  available during my care of the patient were reviewed by me and considered in my medical decision making (see chart for details).   Patient presents to the emergency department status post mechanical fall with complaints of pain to the left clavicle.  No prodromal symptoms.  No evidence of serious head, neck, or back injury.  Do not feel that CT imaging of the head/neck is necessary per [place] CT rules.  No midline spinal tenderness, no focal neurologic deficits-spinal fracture or head bleed.  Patient seems to have isolated injury to the left clavicle, mild deformity noted, no skin tenting, x-ray confirms comminuted left mid to distal clavicular fracture with inferior displacement, NVI distally.  Patient placed in sling immobilizer.  Will discharge home with short course of percocet & orthopedics follow up. I discussed results, treatment plan, need for follow-up, and return precautions with the patient. Provided opportunity for questions, patient confirmed understanding and is in agreement with plan.    Final Clinical Impressions(s) / ED Diagnoses   Final diagnoses:  Closed displaced fracture of shaft of left clavicle, initial encounter    ED Discharge Orders         Ordered    oxyCODONE-acetaminophen (PERCOCET/ROXICET) 5-325 MG tablet  Every 6 hours PRN     01/19/19 1954           Desmond Lopeetrucelli,  R, PA-C 01/19/19 2006    Jacalyn LefevreHaviland, Julie, MD 01/21/19 1311

## 2019-01-19 NOTE — ED Triage Notes (Signed)
Pt reports he fell while riding his skateboard, thinks he broke his left collar bone. Pulses palpable, no numbness or tingling. Sling applied in tiage

## 2019-01-19 NOTE — Discharge Instructions (Addendum)
Please read and follow all provided instructions.  You have been seen today for a fall with a fractured collarbone.  We have placed you into a sling, wear the sling at all times until you have seen orthopedics.  Follow-up with orthopedics within 1 week.  Home care instructions: -- first 24-48 hours after injury: Protect with splint.  Rest Ice- Do not apply ice pack directly to your skin, place towel or similar between your skin and ice/ice pack. Apply ice for 20 min, then remove for 40 min while awake Compression- Wear sling.   Medications:  Please take ibuprofen per over the counter dosing for pain.  If pain not relieved by ibuprofen, we have given you a short term prescription for percocet for severe pain.   -Percocet-this is a narcotic/controlled substance medication that has potential addicting qualities.  We recommend that you take 1-2 tablets every 6 hours as needed for severe pain.  Do not drive or operate heavy machinery when taking this medicine as it can be sedating. Do not drink alcohol or take other sedating medications when taking this medicine for safety reasons.  Keep this out of reach of small children.  Please be aware this medicine has Tylenol in it (325 mg/tab) do not exceed the maximum dose of Tylenol in a day per over the counter recommendations should you decide to supplement with Tylenol over the counter.   We have prescribed you new medication(s) today. Discuss the medications prescribed today with your pharmacist as they can have adverse effects and interactions with your other medicines including over the counter and prescribed medications. Seek medical evaluation if you start to experience new or abnormal symptoms after taking one of these medicines, seek care immediately if you start to experience difficulty breathing, feeling of your throat closing, facial swelling, or rash as these could be indications of a more serious allergic reaction   Follow-up  instructions: Please follow-up with orthopedics within 1 week.   Return instructions:  Please return if your digits or extremity are numb or tingling, appear gray or blue, or you have severe pain (also elevate the extremity and loosen splint or wrap if you were given one) Please return if you have redness or fevers.  Please return to the Emergency Department if you experience worsening symptoms.  Please return if you have any other emergent concerns. Additional Information:  Your vital signs today were: BP 135/85 (BP Location: Right Arm)    Pulse 92    Temp 97.7 F (36.5 C) (Oral)    Resp 20    Ht 6\' 1"  (1.854 m)    Wt 90.7 kg    SpO2 98%    BMI 26.39 kg/m  If your blood pressure (BP) was elevated above 135/85 this visit, please have this repeated by your doctor within one month. ---------------

## 2019-01-25 ENCOUNTER — Encounter (HOSPITAL_BASED_OUTPATIENT_CLINIC_OR_DEPARTMENT_OTHER): Payer: Self-pay | Admitting: *Deleted

## 2019-01-25 ENCOUNTER — Other Ambulatory Visit: Payer: Self-pay

## 2019-01-25 DIAGNOSIS — M25511 Pain in right shoulder: Secondary | ICD-10-CM | POA: Diagnosis not present

## 2019-01-26 NOTE — Pre-Procedure Instructions (Signed)
LMV with instructions for dos. Unable to do updated Covid-19 screening.

## 2019-01-27 ENCOUNTER — Encounter (HOSPITAL_BASED_OUTPATIENT_CLINIC_OR_DEPARTMENT_OTHER)
Admission: RE | Disposition: A | Discharge status: Home / Self Care | Payer: Self-pay | Source: Home / Self Care | Attending: Orthopaedic Surgery

## 2019-01-27 ENCOUNTER — Encounter (HOSPITAL_BASED_OUTPATIENT_CLINIC_OR_DEPARTMENT_OTHER): Payer: Self-pay | Admitting: *Deleted

## 2019-01-27 ENCOUNTER — Other Ambulatory Visit: Payer: Self-pay

## 2019-01-27 ENCOUNTER — Ambulatory Visit (HOSPITAL_BASED_OUTPATIENT_CLINIC_OR_DEPARTMENT_OTHER): Payer: BLUE CROSS/BLUE SHIELD | Admitting: Anesthesiology

## 2019-01-27 ENCOUNTER — Ambulatory Visit (HOSPITAL_COMMUNITY): Payer: BLUE CROSS/BLUE SHIELD

## 2019-01-27 ENCOUNTER — Ambulatory Visit (HOSPITAL_BASED_OUTPATIENT_CLINIC_OR_DEPARTMENT_OTHER)
Admission: RE | Admit: 2019-01-27 | Discharge: 2019-01-27 | Disposition: A | Discharge status: Home / Self Care | Payer: BLUE CROSS/BLUE SHIELD | Attending: Orthopaedic Surgery | Admitting: Orthopaedic Surgery

## 2019-01-27 DIAGNOSIS — Z79899 Other long term (current) drug therapy: Secondary | ICD-10-CM | POA: Insufficient documentation

## 2019-01-27 DIAGNOSIS — Z87891 Personal history of nicotine dependence: Secondary | ICD-10-CM | POA: Insufficient documentation

## 2019-01-27 DIAGNOSIS — S42009A Fracture of unspecified part of unspecified clavicle, initial encounter for closed fracture: Secondary | ICD-10-CM

## 2019-01-27 DIAGNOSIS — F419 Anxiety disorder, unspecified: Secondary | ICD-10-CM | POA: Insufficient documentation

## 2019-01-27 DIAGNOSIS — Z7951 Long term (current) use of inhaled steroids: Secondary | ICD-10-CM | POA: Diagnosis not present

## 2019-01-27 DIAGNOSIS — X58XXXA Exposure to other specified factors, initial encounter: Secondary | ICD-10-CM | POA: Diagnosis not present

## 2019-01-27 DIAGNOSIS — S42002A Fracture of unspecified part of left clavicle, initial encounter for closed fracture: Secondary | ICD-10-CM | POA: Insufficient documentation

## 2019-01-27 DIAGNOSIS — S42022A Displaced fracture of shaft of left clavicle, initial encounter for closed fracture: Secondary | ICD-10-CM | POA: Diagnosis not present

## 2019-01-27 DIAGNOSIS — F329 Major depressive disorder, single episode, unspecified: Secondary | ICD-10-CM | POA: Insufficient documentation

## 2019-01-27 DIAGNOSIS — J45909 Unspecified asthma, uncomplicated: Secondary | ICD-10-CM | POA: Diagnosis not present

## 2019-01-27 DIAGNOSIS — F429 Obsessive-compulsive disorder, unspecified: Secondary | ICD-10-CM | POA: Diagnosis not present

## 2019-01-27 DIAGNOSIS — F909 Attention-deficit hyperactivity disorder, unspecified type: Secondary | ICD-10-CM | POA: Diagnosis not present

## 2019-01-27 HISTORY — PX: ORIF CLAVICULAR FRACTURE: SHX5055

## 2019-01-27 SURGERY — OPEN REDUCTION INTERNAL FIXATION (ORIF) CLAVICULAR FRACTURE
Anesthesia: General | Site: Shoulder | Laterality: Left

## 2019-01-27 MED ORDER — HYDROMORPHONE HCL 1 MG/ML IJ SOLN
INTRAMUSCULAR | Status: AC
  Filled 2019-01-27: qty 0.5

## 2019-01-27 MED ORDER — ROCURONIUM BROMIDE 10 MG/ML (PF) SYRINGE
PREFILLED_SYRINGE | INTRAVENOUS | Status: DC | PRN
  Administered 2019-01-27: 45 mg via INTRAVENOUS
  Administered 2019-01-27: 5 mg via INTRAVENOUS

## 2019-01-27 MED ORDER — ONDANSETRON HCL 4 MG PO TABS: 4.0000 mg | ORAL_TABLET | Freq: Three times a day (TID) | ORAL | 1 refills | Status: AC | PRN

## 2019-01-27 MED ORDER — MELOXICAM 7.5 MG PO TABS: 7.5000 mg | ORAL_TABLET | Freq: Every day | ORAL | 2 refills | Status: AC

## 2019-01-27 MED ORDER — SUCCINYLCHOLINE CHLORIDE 20 MG/ML IJ SOLN
INTRAMUSCULAR | Status: DC | PRN
  Administered 2019-01-27: 120 mg via INTRAVENOUS

## 2019-01-27 MED ORDER — CEFAZOLIN SODIUM-DEXTROSE 2-4 GM/100ML-% IV SOLN
INTRAVENOUS | Status: AC
  Filled 2019-01-27: qty 100

## 2019-01-27 MED ORDER — ARTIFICIAL TEARS OPHTHALMIC OINT
TOPICAL_OINTMENT | OPHTHALMIC | Status: AC
  Filled 2019-01-27: qty 3.5

## 2019-01-27 MED ORDER — ONDANSETRON HCL 4 MG/2ML IJ SOLN
INTRAMUSCULAR | Status: AC
  Filled 2019-01-27: qty 2

## 2019-01-27 MED ORDER — ONDANSETRON HCL 4 MG/2ML IJ SOLN
INTRAMUSCULAR | Status: DC | PRN
  Administered 2019-01-27: 4 mg via INTRAVENOUS

## 2019-01-27 MED ORDER — MIDAZOLAM HCL 2 MG/2ML IJ SOLN
1.0000 mg | INTRAMUSCULAR | Status: DC | PRN
  Administered 2019-01-27: 2 mg via INTRAVENOUS

## 2019-01-27 MED ORDER — SCOPOLAMINE 1 MG/3DAYS TD PT72: 1.0000 | MEDICATED_PATCH | Freq: Once | TRANSDERMAL | Status: DC | PRN

## 2019-01-27 MED ORDER — HYDROMORPHONE HCL 1 MG/ML IJ SOLN
0.2500 mg | INTRAMUSCULAR | Status: DC | PRN
  Administered 2019-01-27 (×3): 0.5 mg via INTRAVENOUS

## 2019-01-27 MED ORDER — ROCURONIUM BROMIDE 50 MG/5ML IV SOSY
PREFILLED_SYRINGE | INTRAVENOUS | Status: AC
  Filled 2019-01-27: qty 5

## 2019-01-27 MED ORDER — OXYCODONE HCL 5 MG PO TABS
ORAL_TABLET | ORAL | Status: AC
  Filled 2019-01-27: qty 1

## 2019-01-27 MED ORDER — MEPERIDINE HCL 25 MG/ML IJ SOLN: 6.2500 mg | INTRAMUSCULAR | Status: DC | PRN

## 2019-01-27 MED ORDER — LIDOCAINE 2% (20 MG/ML) 5 ML SYRINGE
INTRAMUSCULAR | Status: AC
  Filled 2019-01-27: qty 5

## 2019-01-27 MED ORDER — LIDOCAINE 2% (20 MG/ML) 5 ML SYRINGE
INTRAMUSCULAR | Status: DC | PRN
  Administered 2019-01-27: 100 mg via INTRAVENOUS

## 2019-01-27 MED ORDER — FENTANYL CITRATE (PF) 100 MCG/2ML IJ SOLN
50.0000 ug | INTRAMUSCULAR | Status: AC | PRN
  Administered 2019-01-27: 11:00:00 100 ug via INTRAVENOUS
  Administered 2019-01-27 (×2): 50 ug via INTRAVENOUS

## 2019-01-27 MED ORDER — OXYCODONE HCL 5 MG PO TABS: ORAL_TABLET | ORAL | 0 refills | Status: AC

## 2019-01-27 MED ORDER — OXYCODONE HCL 5 MG PO TABS
5.0000 mg | ORAL_TABLET | Freq: Once | ORAL | Status: AC | PRN
  Administered 2019-01-27: 5 mg via ORAL

## 2019-01-27 MED ORDER — PROPOFOL 10 MG/ML IV BOLUS
INTRAVENOUS | Status: AC
  Filled 2019-01-27: qty 20

## 2019-01-27 MED ORDER — SUGAMMADEX SODIUM 200 MG/2ML IV SOLN
INTRAVENOUS | Status: DC | PRN
  Administered 2019-01-27: 180 mg via INTRAVENOUS

## 2019-01-27 MED ORDER — MIDAZOLAM HCL 2 MG/2ML IJ SOLN
INTRAMUSCULAR | Status: AC
  Filled 2019-01-27: qty 2

## 2019-01-27 MED ORDER — DEXAMETHASONE SODIUM PHOSPHATE 10 MG/ML IJ SOLN
INTRAMUSCULAR | Status: DC | PRN
  Administered 2019-01-27: 10 mg via INTRAVENOUS

## 2019-01-27 MED ORDER — LACTATED RINGERS IV SOLN
INTRAVENOUS | Status: DC
  Administered 2019-01-27 (×2): via INTRAVENOUS

## 2019-01-27 MED ORDER — PROMETHAZINE HCL 25 MG/ML IJ SOLN: 6.2500 mg | INTRAMUSCULAR | Status: DC | PRN

## 2019-01-27 MED ORDER — VANCOMYCIN HCL 1000 MG IV SOLR
INTRAVENOUS | Status: AC
  Filled 2019-01-27: qty 1000

## 2019-01-27 MED ORDER — PROPOFOL 10 MG/ML IV BOLUS
INTRAVENOUS | Status: DC | PRN
  Administered 2019-01-27: 50 mg via INTRAVENOUS
  Administered 2019-01-27: 200 mg via INTRAVENOUS

## 2019-01-27 MED ORDER — CHLORHEXIDINE GLUCONATE 4 % EX LIQD: 60.0000 mL | Freq: Once | CUTANEOUS | Status: DC

## 2019-01-27 MED ORDER — FENTANYL CITRATE (PF) 100 MCG/2ML IJ SOLN
INTRAMUSCULAR | Status: AC
  Filled 2019-01-27: qty 2

## 2019-01-27 MED ORDER — PHENYLEPHRINE HCL (PRESSORS) 10 MG/ML IV SOLN
INTRAVENOUS | Status: DC | PRN
  Administered 2019-01-27: 120 ug via INTRAVENOUS

## 2019-01-27 MED ORDER — DEXAMETHASONE SODIUM PHOSPHATE 10 MG/ML IJ SOLN
INTRAMUSCULAR | Status: AC
  Filled 2019-01-27: qty 1

## 2019-01-27 MED ORDER — OXYCODONE HCL 5 MG/5ML PO SOLN: 5.0000 mg | Freq: Once | ORAL | Status: AC | PRN

## 2019-01-27 MED ORDER — BUPIVACAINE HCL (PF) 0.25 % IJ SOLN
INTRAMUSCULAR | Status: DC | PRN
  Administered 2019-01-27: 15 mL

## 2019-01-27 MED ORDER — VANCOMYCIN HCL 1000 MG IV SOLR
INTRAVENOUS | Status: DC | PRN
  Administered 2019-01-27: 1000 mg

## 2019-01-27 MED ORDER — ACETAMINOPHEN 500 MG PO TABS: 1000.0000 mg | ORAL_TABLET | Freq: Three times a day (TID) | ORAL | 0 refills | Status: AC

## 2019-01-27 MED ORDER — SUGAMMADEX SODIUM 500 MG/5ML IV SOLN
INTRAVENOUS | Status: AC
  Filled 2019-01-27: qty 5

## 2019-01-27 MED ORDER — CEFAZOLIN SODIUM-DEXTROSE 2-4 GM/100ML-% IV SOLN
2.0000 g | INTRAVENOUS | Status: AC
  Administered 2019-01-27: 11:00:00 2 g via INTRAVENOUS

## 2019-01-27 SURGICAL SUPPLY — 70 items
BENZOIN TINCTURE PRP APPL 2/3 (GAUZE/BANDAGES/DRESSINGS) IMPLANT
BIT DRILL 2 CANN GRADUATED (BIT) ×3 IMPLANT
BIT DRILL 2.5 CANN ENDOSCOPIC (BIT) ×3 IMPLANT
BIT DRILL 2.7 (BIT) ×2
BIT DRILL 2.7X2.7/3XSCR ANKL (BIT) ×1 IMPLANT
BIT DRL 2.7X2.7/3XSCR ANKL (BIT) ×1
BLADE SURG 10 STRL SS (BLADE) ×3 IMPLANT
BLADE SURG 15 STRL LF DISP TIS (BLADE) ×1 IMPLANT
BLADE SURG 15 STRL SS (BLADE) ×2
BNDG COHESIVE 4X5 TAN STRL (GAUZE/BANDAGES/DRESSINGS) IMPLANT
CHLORAPREP W/TINT 26 (MISCELLANEOUS) ×3 IMPLANT
CLOSURE WOUND 1/2 X4 (GAUZE/BANDAGES/DRESSINGS) ×1
COVER WAND RF STERILE (DRAPES) IMPLANT
DECANTER SPIKE VIAL GLASS SM (MISCELLANEOUS) IMPLANT
DRAPE C-ARM 42X72 X-RAY (DRAPES) ×3 IMPLANT
DRAPE IMP U-DRAPE 54X76 (DRAPES) ×3 IMPLANT
DRAPE INCISE IOBAN 66X45 STRL (DRAPES) ×3 IMPLANT
DRAPE U-SHAPE 76X120 STRL (DRAPES) ×6 IMPLANT
DRSG AQUACEL AG ADV 3.5X 6 (GAUZE/BANDAGES/DRESSINGS) ×3 IMPLANT
ELECT NEEDLE TIP 2.8 STRL (NEEDLE) IMPLANT
ELECT REM PT RETURN 9FT ADLT (ELECTROSURGICAL) ×3
ELECTRODE REM PT RTRN 9FT ADLT (ELECTROSURGICAL) ×1 IMPLANT
GAUZE XEROFORM 1X8 LF (GAUZE/BANDAGES/DRESSINGS) IMPLANT
GLOVE BIO SURGEON STRL SZ8 (GLOVE) ×3 IMPLANT
GLOVE BIOGEL PI IND STRL 8 (GLOVE) ×2 IMPLANT
GLOVE BIOGEL PI IND STRL 8.5 (GLOVE) ×1 IMPLANT
GLOVE BIOGEL PI INDICATOR 8 (GLOVE) ×4
GLOVE BIOGEL PI INDICATOR 8.5 (GLOVE) ×2
GLOVE ECLIPSE 8.0 STRL XLNG CF (GLOVE) ×6 IMPLANT
GLOVE SURG SS PI 6.5 STRL IVOR (GLOVE) ×3 IMPLANT
GOWN STRL REUS W/ TWL LRG LVL3 (GOWN DISPOSABLE) ×1 IMPLANT
GOWN STRL REUS W/ TWL XL LVL3 (GOWN DISPOSABLE) ×1 IMPLANT
GOWN STRL REUS W/TWL LRG LVL3 (GOWN DISPOSABLE) ×2
GOWN STRL REUS W/TWL XL LVL3 (GOWN DISPOSABLE) ×5 IMPLANT
KIT STABILIZATION SHOULDER (MISCELLANEOUS) ×3 IMPLANT
PACK ARTHROSCOPY DSU (CUSTOM PROCEDURE TRAY) ×3 IMPLANT
PACK BASIN DAY SURGERY FS (CUSTOM PROCEDURE TRAY) ×3 IMPLANT
PENCIL BUTTON HOLSTER BLD 10FT (ELECTRODE) ×3 IMPLANT
PLATE CLAVICLE 10H LT 3RD CENT (Plate) ×3 IMPLANT
RESTRAINT HEAD UNIVERSAL NS (MISCELLANEOUS) ×3 IMPLANT
SCREW CORTEX LP TM SS 2.7X16 (Screw) ×6 IMPLANT
SCREW LOCK T15 FT 20X3.5 ST (Screw) ×1 IMPLANT
SCREW LOCKING 3.5X20 (Screw) ×2 IMPLANT
SCREW LOW PROFILE 3.5X16 (Screw) ×3 IMPLANT
SCREW NLOCK T15 FT 18X3.5XST (Screw) ×3 IMPLANT
SCREW NON LOCK 3.5X18MM (Screw) ×6 IMPLANT
SCREW NON-LOCKING 3.5X20 ANKLE (Screw) ×6 IMPLANT
SCREW TM SS 2.7X14 CORTEX (Screw) ×3 IMPLANT
SHEET MEDIUM DRAPE 40X70 STRL (DRAPES) ×3 IMPLANT
SLEEVE SCD COMPRESS KNEE MED (MISCELLANEOUS) ×3 IMPLANT
SLEEVE SURGEON STRL (DRAPES) ×3 IMPLANT
SLING ARM FOAM STRAP LRG (SOFTGOODS) IMPLANT
SLING ARM FOAM STRAP XLG (SOFTGOODS) ×3 IMPLANT
SLING ARM MED ADULT FOAM STRAP (SOFTGOODS) IMPLANT
SLING ARM XL FOAM STRAP (SOFTGOODS) IMPLANT
SPONGE LAP 18X18 RF (DISPOSABLE) ×3 IMPLANT
SPONGE LAP 4X18 RFD (DISPOSABLE) IMPLANT
STRIP CLOSURE SKIN 1/2X4 (GAUZE/BANDAGES/DRESSINGS) ×2 IMPLANT
SUCTION FRAZIER HANDLE 10FR (MISCELLANEOUS) ×2
SUCTION TUBE FRAZIER 10FR DISP (MISCELLANEOUS) ×1 IMPLANT
SUT MNCRL AB 3-0 PS2 18 (SUTURE) IMPLANT
SUT MNCRL AB 4-0 PS2 18 (SUTURE) IMPLANT
SUT MON AB 2-0 CT1 36 (SUTURE) ×3 IMPLANT
SUT VIC AB 0 CT1 18XCR BRD 8 (SUTURE) ×1 IMPLANT
SUT VIC AB 0 CT1 8-18 (SUTURE) ×2
SUT VIC AB 2-0 SH 27 (SUTURE) ×2
SUT VIC AB 2-0 SH 27XBRD (SUTURE) ×1 IMPLANT
SYR BULB 3OZ (MISCELLANEOUS) ×3 IMPLANT
TOWEL GREEN STERILE FF (TOWEL DISPOSABLE) ×6 IMPLANT
YANKAUER SUCT BULB TIP NO VENT (SUCTIONS) ×3 IMPLANT

## 2019-01-27 NOTE — Op Note (Signed)
Orthopaedic Surgery Operative Note (CSN: [id number])  Isaiah Stevens  April 10, 1991 Date of Surgery: 01/27/2019   Diagnoses:  LEFT CLAVICLE FRACTURE  Procedure: Reduction internal fixation of left clavicle fracture   Operative Finding Successful completion of planned procedure.  There was a small area that came off of the plate as were trying to manipulate it because the small finger stick into my right index finger.  Standard laboratory markers were taken.  The small burr was removed from the plate and thus was not implanted.  The area was irrigated thoroughly.  Good fixation of the fracture.  There is a significantly comminuted fracture that was simplified with 1 of the largest butterfly fragments able to bridge across the fracture site.  We then put a plate on and bridging fashion but it was relatively lateral and we had to aim screws atypically to get appropriate purchase in the lateral fragment.  Good fixation at the end.  One locking screw laterally.  Post-operative plan: The patient will be nonweightbearing in a sling for 3 weeks then range of motion up to 90 degrees as tolerated.  The patient will be discharged home.  DVT prophylaxis not indicated in amatory young patient with no risk factors.  Pain control with PRN pain medication preferring oral medicines.  Follow up plan will be scheduled in approximately 7 days for incision check and XR.  Post-Op Diagnosis: Same Surgeons:Primary: Bjorn PippinVarkey, Dewie Ahart T, MD Assistants: Janace LittenBrandon Parry OPAC [facility] Anesthesia: General Antibiotics: Ancef 2g preop, Vancomycin 1000 mg locally  Tourniquet time: * No tourniquets in log * Estimated Blood Loss: 50 Complications: Non Specimens: None Implants: Implant Name Type Inv. Item Serial No. Manufacturer Lot No. LRB No. Used Action  SCREW CORTEX LP TM SS 2.7X16 - [id number] Screw SCREW CORTEX LP TM SS 2.7X16  [employer]  Left 2 Implanted  Screw Cortex 3.5 Locking 20MM    [employer]  ON STERILE SET Left 1 Implanted  SCREW LOW PROFILE 3.5X14 - [id number] Screw SCREW LOW PROFILE 3.5X14  [employer] ON STERILE SET Left 1 Implanted  SCREW NON LOCK 3.5X18MM - [id number] Screw SCREW NON LOCK 3.5X18MM  [employer] ON STERILE SET Left 1 Implanted  SCREW NON-LOCKING 3.5X20 ANKLE - [id number] Screw SCREW NON-LOCKING 3.5X20 ANKLE  [employer] ON STERILE SET Left 1 Implanted    Indications for Surgery:   Isaiah Stevens is a 28 y.o. male with fall resulting in a comminuted displaced greater than 100% left clavicle fracture with significant comminution.  We talked about the options that this active young patient who wants to return to function as quick as possible with the most predictable results we felt that surgery would be appropriate.  Benefits and risks of operative and nonoperative management were discussed prior to surgery with patient/guardian(s) and informed consent form was completed.  Specific risks including infection, need for additional surgery, hardware complications, nonunion, malunion, periprosthetic fracture and peri-incisional numbness.   Procedure:   The patient was identified in the preoperative holding area where the surgical site was marked. The patient was taken to the OR where a procedural timeout was called and the above noted anesthesia was induced.  The patient was positioned beachchair on allen.  Preoperative antibiotics were dosed.  The patient's left shoulder was prepped and draped in the usual sterile fashion.  A second preoperative timeout was called.      The fracture site was palpated along the clavicle and a linear incision along the anterior border  of the clavicle was made through the skin sharply. We then used Bovie electrocautery to achieve careful hemostasis.  At that point we made small full-thickness flaps and identified the superior border of the clavicle and used the Bovie to create full-thickness muscular flaps on the superior border of the  clavicle out of plane with our incision.  Cleared periosteum at the fracture site and were able to identify the entirety of the fracture site.  We a currette to sharply debride the fracture and remove any retained callus or hematoma.  Fracture was more complex and was easily noted on initial preoperative radiographs.  There was a large anterior inferior butterfly fragment that did span the gap but there was an inferior amount of comminution that we are unable to really fix.  We used a lag screws to fix from the inferior anterior the butterfly fragment spanning and simplifying the fracture site.  We then had some superior comminution that was placed under the plate for local bone graft and the inferior comminution was left alone.  We placed a long precontoured Arthrex stainless steel locking plate across the fracture using fluoroscopy to guide its position.  We were able to place the plate far enough lateral that we were able to get good purchase in the lateral fragment.  We obtained 6 cortices of purchase on each side of the fracture however we switched 1 of our central screws in the lateral aspect to a locking screw for extra security.  Final fluoroscopic images demonstrated appropriate alignment of the clavicle with the comminution as noted above.  Were happy with the final reduction.  The incision was irrigated copiously and free from debris and any foreign body.  The deep muscular layer was closed with interrupted figure-of-eight Vicryl sutures watertight.  Skin was closed in a multilayer fashion with absorbable sutures.  A sterile dressing and sling were placed and the patient was awoken and taken to PACU in stable condition.  Janace Litten, OPA-C, present and scrubbed throughout the case, critical for completion in a timely fashion, and for retraction, instrumentation, closure.

## 2019-01-27 NOTE — H&P (Signed)
PREOPERATIVE H&P  Chief Complaint: LEFT CLAVICLE FRACTURE  HPI: Isaiah Stevens is a 28 y.o. male who presents for preoperative history and physical with a diagnosis of LEFT CLAVICLE FRACTURE. Symptoms are rated as moderate to severe, and have been worsening.  This is significantly impairing activities of daily living.  Please see my clinic note for full details on this patient's care.  He has elected for surgical management.   Past Medical History:  Diagnosis Date  . ADHD   . Anxiety   . Asthma   . Eczema   . OCD (obsessive compulsive disorder)   . Panic attack   . Suicide (HCC) 03/2018   "suicide attempt"   Past Surgical History:  Procedure Laterality Date  . NO PAST SURGERIES     Social History   Socioeconomic History  . Marital status: Single    Spouse name: Not on file  . Number of children: Not on file  . Years of education: Not on file  . Highest education level: Not on file  Occupational History  . Not on file  Social Needs  . Financial resource strain: Not on file  . Food insecurity:    Worry: Not on file    Inability: Not on file  . Transportation needs:    Medical: Not on file    Non-medical: Not on file  Tobacco Use  . Smoking status: Former Smoker    Types: E-cigarettes    Last attempt to quit: 11/06/2018    Years since quitting: 0.2  . Smokeless tobacco: Never Used  Substance and Sexual Activity  . Alcohol use: Yes    Comment: occasional   . Drug use: Yes    Types: Marijuana    Comment:  a month ago  . Sexual activity: Not on file  Lifestyle  . Physical activity:    Days per week: Not on file    Minutes per session: Not on file  . Stress: Not on file  Relationships  . Social connections:    Talks on phone: Not on file    Gets together: Not on file    Attends religious service: Not on file    Active member of club or organization: Not on file    Attends meetings of clubs or organizations: Not on file    Relationship status: Not on  file  Other Topics Concern  . Not on file  Social History Narrative   Caffeine 2 cups coffee a week. Works [employer]   Family History  Problem Relation Age of Onset  . Heart disease Mother 29       Pacemaker, history of syncope  . Diabetes Father        Type II   No Known Allergies Prior to Admission medications   Medication Sig Start Date End Date Taking? Authorizing Provider  albuterol (PROVENTIL HFA;VENTOLIN HFA) 108 (90 BASE) MCG/ACT inhaler Inhale 2 puffs into the lungs every 6 (six) hours as needed.   Yes [provider]  clomiPHENE (CLOMID) 50 MG tablet Take by mouth daily.   Yes [provider]  Fluticasone-Salmeterol (ADVAIR) 100-50 MCG/DOSE AEPB Inhale 1 puff into the lungs every 12 (twelve) hours.   Yes [provider]  loratadine (CLARITIN) 10 MG tablet Take 10 mg by mouth daily as needed for allergies.   Yes [provider]  montelukast (SINGULAIR) 10 MG tablet Take 10 mg by mouth at bedtime.   Yes [provider]  oxyCODONE-acetaminophen (PERCOCET/ROXICET) 5-325 MG tablet  Take 1-2 tablets by mouth every 6 (six) hours as needed for severe pain. 01/19/19  Yes Petrucelli, Samantha R, PA-C  sertraline (ZOLOFT) 50 MG tablet Take 50 mg by mouth daily.   Yes Alver FisherLindner, Jodi N, RN     Positive ROS: All other systems have been reviewed and were otherwise negative with the exception of those mentioned in the HPI and as above.  Physical Exam: General: Alert, no acute distress Cardiovascular: No pedal edema Respiratory: No cyanosis, no use of accessory musculature GI: No organomegaly, abdomen is soft and non-tender Skin: No lesions in the area of chief complaint Neurologic: Sensation intact distally Psychiatric: Patient is competent for consent with normal mood and affect Lymphatic: No axillary or cervical lymphadenopathy  MUSCULOSKELETAL: L clavicle asymmetry of skin, wwp hand, NVID  Assessment: LEFT CLAVICLE FRACTURE  Plan: Plan  for Procedure(s): OPEN REDUCTION INTERNAL FIXATION (ORIF)LEFT CLAVICULAR FRACTURE  The risks benefits and alternatives were discussed with the patient including but not limited to the risks of nonoperative treatment, versus surgical intervention including infection, bleeding, nerve injury,  blood clots, cardiopulmonary complications, morbidity, mortality, among others, and they were willing to proceed.   Bjorn Pippinax T Makaio Mach, MD  01/27/2019 9:56 AM

## 2019-01-27 NOTE — Anesthesia Procedure Notes (Signed)
Procedure Name: Intubation Date/Time: 01/27/2019 11:04 AM Performed by: Wanita Chamberlain, CRNA Pre-anesthesia Checklist: Patient being monitored, Timeout performed, Suction available, Emergency Drugs available and Patient identified Patient Re-evaluated:Patient Re-evaluated prior to induction Oxygen Delivery Method: Circle system utilized Preoxygenation: Pre-oxygenation with 100% oxygen Induction Type: IV induction Ventilation: Mask ventilation without difficulty Laryngoscope Size: Mac and 4 Grade View: Grade I Tube type: Oral Laser Tube: Cuffed inflated with minimal occlusive pressure - saline Tube size: 8.0 mm Number of attempts: 1 Placement Confirmation: breath sounds checked- equal and bilateral,  CO2 detector,  positive ETCO2 and ETT inserted through vocal cords under direct vision Secured at: 22 cm Tube secured with: Tape Dental Injury: Teeth and Oropharynx as per pre-operative assessment

## 2019-01-27 NOTE — Anesthesia Preprocedure Evaluation (Addendum)
Anesthesia Evaluation  Patient identified by MRN, date of birth, ID band Patient awake    Reviewed: Allergy & Precautions, NPO status , Patient's Chart, lab work & pertinent test results  Airway Mallampati: II  TM Distance: >3 FB Neck ROM: Full    Dental no notable dental hx. (+) Teeth Intact, Dental Advisory Given   Pulmonary asthma , former smoker,    Pulmonary exam normal breath sounds clear to auscultation       Cardiovascular negative cardio ROS Normal cardiovascular exam Rhythm:Regular Rate:Normal     Neuro/Psych Anxiety Depression negative neurological ROS  negative psych ROS   GI/Hepatic negative GI ROS, Neg liver ROS,   Endo/Other  negative endocrine ROS  Renal/GU negative Renal ROS  negative genitourinary   Musculoskeletal negative musculoskeletal ROS (+)   Abdominal   Peds negative pediatric ROS (+)  Hematology negative hematology ROS (+)   Anesthesia Other Findings   Reproductive/Obstetrics negative OB ROS                            Anesthesia Physical Anesthesia Plan  ASA: II  Anesthesia Plan: General   Post-op Pain Management:    Induction: Intravenous  PONV Risk Score and Plan: 2 and Ondansetron and Midazolam  Airway Management Planned: LMA and Oral ETT  Additional Equipment:   Intra-op Plan:   Post-operative Plan: Extubation in OR  Informed Consent: I have reviewed the patients History and Physical, chart, labs and discussed the procedure including the risks, benefits and alternatives for the proposed anesthesia with the patient or authorized representative who has indicated his/her understanding and acceptance.     Dental advisory given  Plan Discussed with: CRNA  Anesthesia Plan Comments:         Anesthesia Quick Evaluation

## 2019-01-27 NOTE — Transfer of Care (Signed)
Immediate Anesthesia Transfer of Care Note  Patient: Isaiah Stevens  Procedure(s) Performed: OPEN REDUCTION INTERNAL FIXATION (ORIF)LEFT CLAVICULAR FRACTURE (Left Shoulder)  Patient Location: PACU  Anesthesia Type:General  Level of Consciousness: awake, alert , oriented and patient cooperative  Airway & Oxygen Therapy: Patient Spontanous Breathing and Patient connected to nasal cannula oxygen  Post-op Assessment: Report given to RN and Post -op Vital signs reviewed and stable  Post vital signs: Reviewed and stable  Last Vitals:  Vitals Value Taken Time  BP 140/82 01/27/2019  1:00 PM  Temp    Pulse 105 01/27/2019  1:02 PM  Resp 23 01/27/2019  1:02 PM  SpO2 100 % 01/27/2019  1:02 PM  Vitals shown include unvalidated device data.  Last Pain:  Vitals:   01/27/19 0941  TempSrc: Oral  PainSc: 3          Complications: No apparent anesthesia complications

## 2019-01-27 NOTE — Discharge Instructions (Signed)

## 2019-01-27 NOTE — Anesthesia Postprocedure Evaluation (Signed)
Anesthesia Post Note  Patient: Isaiah Stevens  Procedure(s) Performed: OPEN REDUCTION INTERNAL FIXATION (ORIF)LEFT CLAVICULAR FRACTURE (Left Shoulder)     Patient location during evaluation: PACU Anesthesia Type: General Level of consciousness: awake and alert Pain management: pain level controlled Vital Signs Assessment: post-procedure vital signs reviewed and stable Respiratory status: spontaneous breathing, nonlabored ventilation and respiratory function stable Cardiovascular status: blood pressure returned to baseline and stable Postop Assessment: no apparent nausea or vomiting Anesthetic complications: no    Last Vitals:  Vitals:   01/27/19 1345 01/27/19 1358  BP: (!) 149/95 (!) 144/86  Pulse: 82 80  Resp: 18 15  Temp:    SpO2: 100% 100%    Last Pain:  Vitals:   01/27/19 1358  TempSrc:   PainSc: 5                  Lowella Curb

## 2019-01-28 ENCOUNTER — Encounter (HOSPITAL_BASED_OUTPATIENT_CLINIC_OR_DEPARTMENT_OTHER): Payer: Self-pay | Admitting: Orthopaedic Surgery

## 2019-02-03 DIAGNOSIS — F331 Major depressive disorder, recurrent, moderate: Secondary | ICD-10-CM | POA: Diagnosis not present

## 2019-02-03 DIAGNOSIS — F4010 Social phobia, unspecified: Secondary | ICD-10-CM | POA: Diagnosis not present

## 2019-02-03 DIAGNOSIS — F410 Panic disorder [episodic paroxysmal anxiety] without agoraphobia: Secondary | ICD-10-CM | POA: Diagnosis not present

## 2019-02-04 DIAGNOSIS — S42002D Fracture of unspecified part of left clavicle, subsequent encounter for fracture with routine healing: Secondary | ICD-10-CM | POA: Diagnosis not present

## 2019-03-14 DIAGNOSIS — M25512 Pain in left shoulder: Secondary | ICD-10-CM | POA: Diagnosis not present

## 2019-03-14 DIAGNOSIS — S42002A Fracture of unspecified part of left clavicle, initial encounter for closed fracture: Secondary | ICD-10-CM | POA: Diagnosis not present

## 2019-03-15 DIAGNOSIS — S42002D Fracture of unspecified part of left clavicle, subsequent encounter for fracture with routine healing: Secondary | ICD-10-CM | POA: Diagnosis not present

## 2019-03-18 DIAGNOSIS — F4010 Social phobia, unspecified: Secondary | ICD-10-CM | POA: Diagnosis not present

## 2019-03-18 DIAGNOSIS — F422 Mixed obsessional thoughts and acts: Secondary | ICD-10-CM | POA: Diagnosis not present

## 2019-03-18 DIAGNOSIS — F3342 Major depressive disorder, recurrent, in full remission: Secondary | ICD-10-CM | POA: Diagnosis not present

## 2019-03-28 DIAGNOSIS — S42002A Fracture of unspecified part of left clavicle, initial encounter for closed fracture: Secondary | ICD-10-CM | POA: Diagnosis not present

## 2019-03-28 DIAGNOSIS — M25512 Pain in left shoulder: Secondary | ICD-10-CM | POA: Diagnosis not present

## 2019-03-30 DIAGNOSIS — M25512 Pain in left shoulder: Secondary | ICD-10-CM | POA: Diagnosis not present

## 2019-03-30 DIAGNOSIS — S42002A Fracture of unspecified part of left clavicle, initial encounter for closed fracture: Secondary | ICD-10-CM | POA: Diagnosis not present

## 2019-06-10 DIAGNOSIS — F411 Generalized anxiety disorder: Secondary | ICD-10-CM | POA: Diagnosis not present

## 2019-06-10 DIAGNOSIS — J4540 Moderate persistent asthma, uncomplicated: Secondary | ICD-10-CM | POA: Diagnosis not present

## 2019-06-10 DIAGNOSIS — J3089 Other allergic rhinitis: Secondary | ICD-10-CM | POA: Diagnosis not present

## 2019-06-10 DIAGNOSIS — F3341 Major depressive disorder, recurrent, in partial remission: Secondary | ICD-10-CM | POA: Diagnosis not present

## 2019-06-30 DIAGNOSIS — F900 Attention-deficit hyperactivity disorder, predominantly inattentive type: Secondary | ICD-10-CM | POA: Diagnosis not present

## 2019-06-30 DIAGNOSIS — F4010 Social phobia, unspecified: Secondary | ICD-10-CM | POA: Diagnosis not present

## 2019-07-01 DIAGNOSIS — K603 Anal fistula: Secondary | ICD-10-CM | POA: Diagnosis not present

## 2019-07-08 DIAGNOSIS — F411 Generalized anxiety disorder: Secondary | ICD-10-CM | POA: Diagnosis not present

## 2019-07-27 DIAGNOSIS — K603 Anal fistula: Secondary | ICD-10-CM | POA: Diagnosis not present

## 2019-07-29 DIAGNOSIS — J45909 Unspecified asthma, uncomplicated: Secondary | ICD-10-CM | POA: Diagnosis not present

## 2019-07-29 DIAGNOSIS — Z87891 Personal history of nicotine dependence: Secondary | ICD-10-CM | POA: Diagnosis not present

## 2019-07-29 DIAGNOSIS — Z79899 Other long term (current) drug therapy: Secondary | ICD-10-CM | POA: Diagnosis not present

## 2019-07-29 DIAGNOSIS — F909 Attention-deficit hyperactivity disorder, unspecified type: Secondary | ICD-10-CM | POA: Diagnosis not present

## 2019-07-29 DIAGNOSIS — Z833 Family history of diabetes mellitus: Secondary | ICD-10-CM | POA: Diagnosis not present

## 2019-07-29 DIAGNOSIS — K603 Anal fistula: Secondary | ICD-10-CM | POA: Diagnosis not present

## 2019-07-29 DIAGNOSIS — F419 Anxiety disorder, unspecified: Secondary | ICD-10-CM | POA: Diagnosis not present

## 2019-07-29 DIAGNOSIS — Z8249 Family history of ischemic heart disease and other diseases of the circulatory system: Secondary | ICD-10-CM | POA: Diagnosis not present

## 2019-11-03 DIAGNOSIS — F902 Attention-deficit hyperactivity disorder, combined type: Secondary | ICD-10-CM | POA: Diagnosis not present

## 2019-11-03 DIAGNOSIS — R6882 Decreased libido: Secondary | ICD-10-CM | POA: Diagnosis not present

## 2019-11-03 DIAGNOSIS — F3341 Major depressive disorder, recurrent, in partial remission: Secondary | ICD-10-CM | POA: Diagnosis not present

## 2019-11-03 DIAGNOSIS — F411 Generalized anxiety disorder: Secondary | ICD-10-CM | POA: Diagnosis not present

## 2019-11-03 DIAGNOSIS — N529 Male erectile dysfunction, unspecified: Secondary | ICD-10-CM | POA: Diagnosis not present

## 2019-11-09 DIAGNOSIS — K603 Anal fistula: Secondary | ICD-10-CM | POA: Diagnosis not present

## 2019-12-08 DIAGNOSIS — K603 Anal fistula: Secondary | ICD-10-CM | POA: Diagnosis not present

## 2019-12-08 DIAGNOSIS — K602 Anal fissure, unspecified: Secondary | ICD-10-CM | POA: Diagnosis not present

## 2020-01-13 DIAGNOSIS — J3089 Other allergic rhinitis: Secondary | ICD-10-CM | POA: Diagnosis not present

## 2020-01-13 DIAGNOSIS — R7989 Other specified abnormal findings of blood chemistry: Secondary | ICD-10-CM | POA: Diagnosis not present

## 2020-01-13 DIAGNOSIS — J4540 Moderate persistent asthma, uncomplicated: Secondary | ICD-10-CM | POA: Diagnosis not present

## 2020-01-13 DIAGNOSIS — N529 Male erectile dysfunction, unspecified: Secondary | ICD-10-CM | POA: Diagnosis not present

## 2020-02-12 DIAGNOSIS — Z03818 Encounter for observation for suspected exposure to other biological agents ruled out: Secondary | ICD-10-CM | POA: Diagnosis not present

## 2020-02-12 DIAGNOSIS — Z20828 Contact with and (suspected) exposure to other viral communicable diseases: Secondary | ICD-10-CM | POA: Diagnosis not present

## 2020-02-16 DIAGNOSIS — Z20828 Contact with and (suspected) exposure to other viral communicable diseases: Secondary | ICD-10-CM | POA: Diagnosis not present

## 2020-02-16 DIAGNOSIS — F900 Attention-deficit hyperactivity disorder, predominantly inattentive type: Secondary | ICD-10-CM | POA: Diagnosis not present

## 2020-02-16 DIAGNOSIS — Z03818 Encounter for observation for suspected exposure to other biological agents ruled out: Secondary | ICD-10-CM | POA: Diagnosis not present

## 2020-02-16 DIAGNOSIS — F4010 Social phobia, unspecified: Secondary | ICD-10-CM | POA: Diagnosis not present

## 2020-03-17 IMAGING — DX LEFT CLAVICLE - 2+ VIEWS
2 series · 2 of 2 positions shown · non-contrast
Comparison: None.

CLINICAL DATA: Left shoulder pain

EXAM:
LEFT CLAVICLE - 2+ VIEWS

[w clavicle ap left]
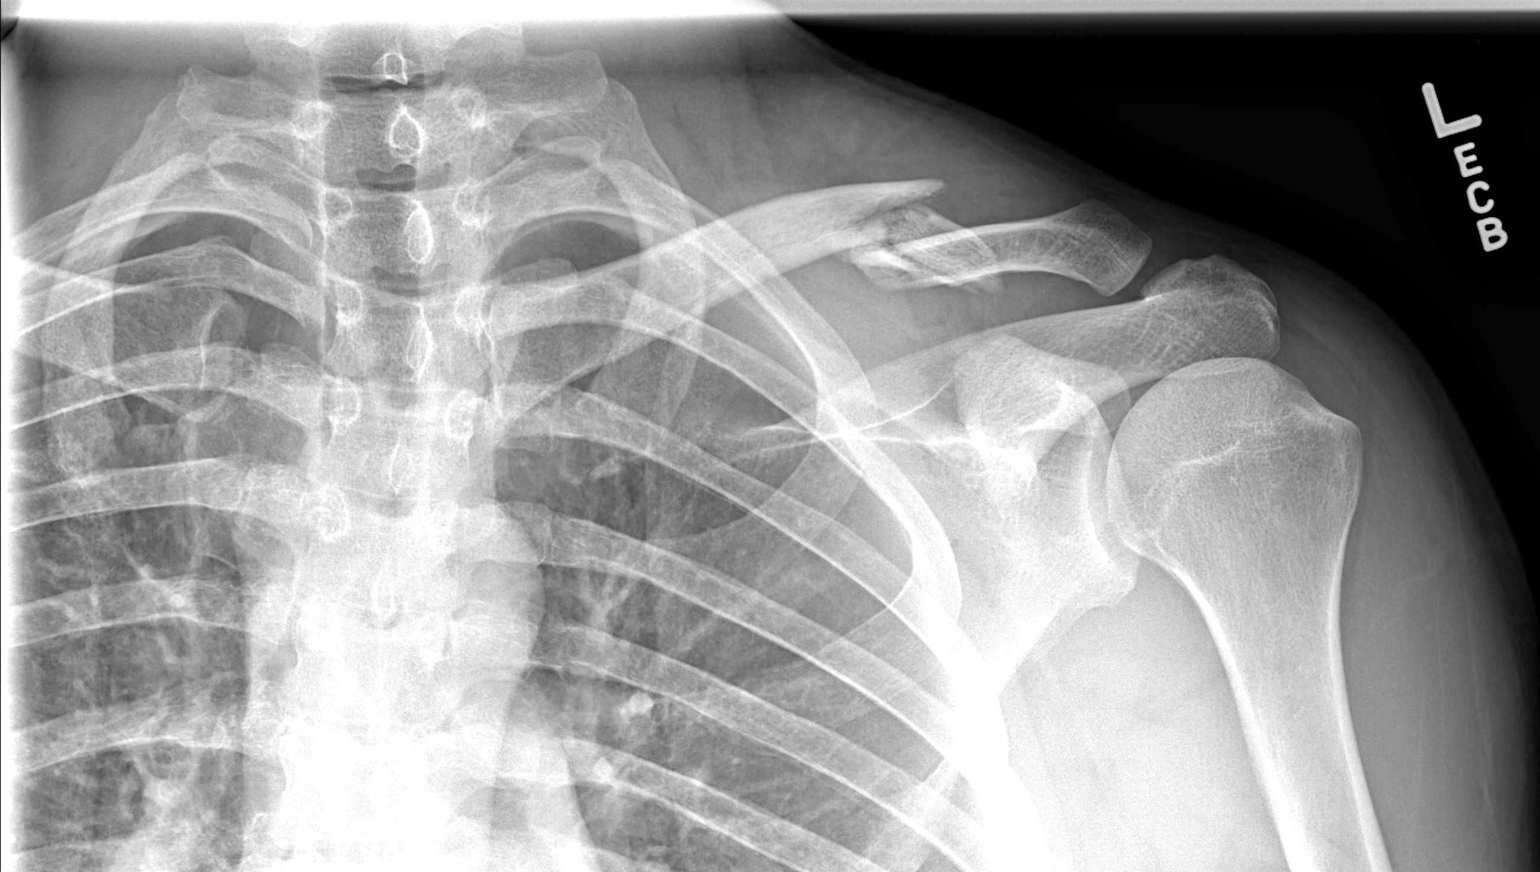

[w clavicle tangential left]
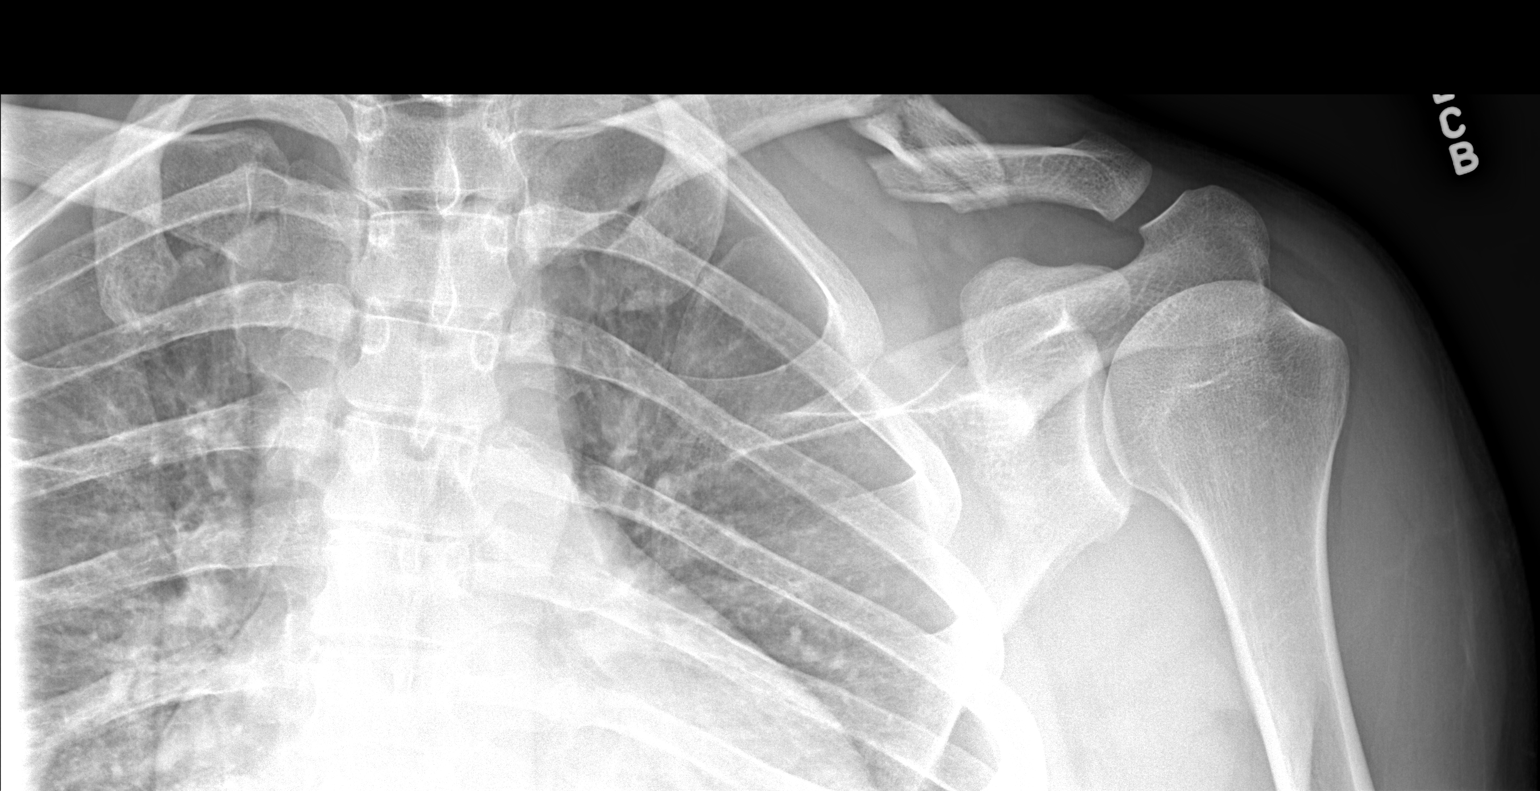

[2 of 2 positions shown; findings below may reference images not displayed]

FINDINGS: Comminuted left mid-distal clavicular fracture with 1 shaft with
inferior displacement. No other fracture or dislocation. Normal
acromioclavicular joint.
IMPRESSION: Comminuted left mid-distal clavicular fracture with 1 shaft with
inferior displacement.

## 2020-07-03 DIAGNOSIS — F410 Panic disorder [episodic paroxysmal anxiety] without agoraphobia: Secondary | ICD-10-CM | POA: Diagnosis not present

## 2020-08-27 DIAGNOSIS — J45909 Unspecified asthma, uncomplicated: Secondary | ICD-10-CM | POA: Diagnosis not present

## 2020-08-27 DIAGNOSIS — R5383 Other fatigue: Secondary | ICD-10-CM | POA: Diagnosis not present

## 2020-08-27 DIAGNOSIS — M89312 Hypertrophy of bone, left shoulder: Secondary | ICD-10-CM | POA: Diagnosis not present

## 2020-08-27 DIAGNOSIS — Z23 Encounter for immunization: Secondary | ICD-10-CM | POA: Diagnosis not present

## 2020-08-27 DIAGNOSIS — E291 Testicular hypofunction: Secondary | ICD-10-CM | POA: Diagnosis not present

## 2020-08-27 DIAGNOSIS — Z0000 Encounter for general adult medical examination without abnormal findings: Secondary | ICD-10-CM | POA: Diagnosis not present

## 2020-08-27 DIAGNOSIS — Z13220 Encounter for screening for lipoid disorders: Secondary | ICD-10-CM | POA: Diagnosis not present

## 2020-08-29 DIAGNOSIS — S42002D Fracture of unspecified part of left clavicle, subsequent encounter for fracture with routine healing: Secondary | ICD-10-CM | POA: Diagnosis not present

## 2020-09-06 DIAGNOSIS — N528 Other male erectile dysfunction: Secondary | ICD-10-CM | POA: Diagnosis not present

## 2020-09-06 DIAGNOSIS — E291 Testicular hypofunction: Secondary | ICD-10-CM | POA: Diagnosis not present

## 2020-09-17 ENCOUNTER — Encounter (HOSPITAL_BASED_OUTPATIENT_CLINIC_OR_DEPARTMENT_OTHER): Payer: Self-pay | Admitting: *Deleted

## 2020-09-17 ENCOUNTER — Emergency Department (HOSPITAL_BASED_OUTPATIENT_CLINIC_OR_DEPARTMENT_OTHER)
Admission: EM | Admit: 2020-09-17 | Discharge: 2020-09-17 | Disposition: A | Discharge status: Home / Self Care | Payer: BC Managed Care – PPO | Attending: Emergency Medicine | Admitting: Emergency Medicine

## 2020-09-17 ENCOUNTER — Other Ambulatory Visit: Payer: Self-pay

## 2020-09-17 ENCOUNTER — Emergency Department (HOSPITAL_BASED_OUTPATIENT_CLINIC_OR_DEPARTMENT_OTHER): Payer: BC Managed Care – PPO

## 2020-09-17 DIAGNOSIS — I2510 Atherosclerotic heart disease of native coronary artery without angina pectoris: Secondary | ICD-10-CM | POA: Insufficient documentation

## 2020-09-17 DIAGNOSIS — R0789 Other chest pain: Secondary | ICD-10-CM | POA: Insufficient documentation

## 2020-09-17 DIAGNOSIS — Z87891 Personal history of nicotine dependence: Secondary | ICD-10-CM | POA: Diagnosis not present

## 2020-09-17 DIAGNOSIS — Z7951 Long term (current) use of inhaled steroids: Secondary | ICD-10-CM | POA: Diagnosis not present

## 2020-09-17 DIAGNOSIS — J45909 Unspecified asthma, uncomplicated: Secondary | ICD-10-CM | POA: Diagnosis not present

## 2020-09-17 DIAGNOSIS — R0602 Shortness of breath: Secondary | ICD-10-CM | POA: Diagnosis not present

## 2020-09-17 DIAGNOSIS — R6883 Chills (without fever): Secondary | ICD-10-CM | POA: Diagnosis not present

## 2020-09-17 DIAGNOSIS — R079 Chest pain, unspecified: Secondary | ICD-10-CM | POA: Diagnosis not present

## 2020-09-17 NOTE — ED Provider Notes (Signed)
[facility] EMERGENCY DEPARTMENT Provider Note  CSN: [id number] Arrival date & time: 09/17/20 1740    History Chief Complaint  Patient presents with   Chest Pain    HPI  Isaiah Stevens is a 29 y.o. male with history of asthma, anxiety and OCD but no known CAD reports 2 months of intermittent chest pains described as a dullness and a fatigue and SOB described as being difficult to take a full breath. Not associated with fever, cough, URI symptoms, N/V/D or diaphoresis. He was seen at his PCP about 2-3 weeks ago for a regularly scheduled physical then and mentioned it to them. They did some routine bloodwork but no additional tests such as EKG or CXR. He has continued to have symptoms in the interim despite stopping his nicotine patch. He denies any recent drug or alcohol use. Labs from PCP were reviewed on his patient portal and normal, including CBC, CMP, TSH, Sed rate and Testosterone   Past Medical History:  Diagnosis Date   ADHD    Anxiety    Asthma    Eczema    OCD (obsessive compulsive disorder)    Panic attack    Suicide (HCC) 03/2018   "suicide attempt"    Past Surgical History:  Procedure Laterality Date   NO PAST SURGERIES     ORIF CLAVICULAR FRACTURE Left 01/27/2019   Procedure: OPEN REDUCTION INTERNAL FIXATION (ORIF)LEFT CLAVICULAR FRACTURE;  Surgeon: Bjorn Pippin, MD;  Location: [facility];  Service: Orthopedics;  Laterality: Left;    Family History  Problem Relation Age of Onset   Heart disease Mother 28       Pacemaker, history of syncope   Diabetes Father        Type II    Social History   Tobacco Use   Smoking status: Former Smoker    Types: E-cigarettes    Quit date: 11/06/2018    Years since quitting: 1.8   Smokeless tobacco: Never Used  Vaping Use   Vaping Use: Every day   Substances: Nicotine  Substance Use Topics   Alcohol use: Yes    Comment: occasional    Drug use: Yes    Types:  Marijuana    Comment:  a month ago     Home Medications Prior to Admission medications   Medication Sig Start Date End Date Taking? Authorizing Provider  albuterol (PROVENTIL HFA;VENTOLIN HFA) 108 (90 BASE) MCG/ACT inhaler Inhale 2 puffs into the lungs every 6 (six) hours as needed.   Yes [provider]  clomiPHENE (CLOMID) 50 MG tablet Take by mouth daily.   Yes [provider]  loratadine (CLARITIN) 10 MG tablet Take 10 mg by mouth daily as needed for allergies.   Yes [provider]  montelukast (SINGULAIR) 10 MG tablet Take 10 mg by mouth at bedtime.   Yes [provider]  Fluticasone-Salmeterol (ADVAIR) 100-50 MCG/DOSE AEPB Inhale 1 puff into the lungs every 12 (twelve) hours.    [provider]  sertraline (ZOLOFT) 50 MG tablet Take 50 mg by mouth daily.    Alver Fisher, RN     Allergies    Patient has no known allergies.   Review of Systems   Review of Systems A comprehensive review of systems was completed and negative except as noted in HPI.    Physical Exam BP 120/71    Pulse 71    Temp 98.3 F (36.8 C) (Oral)    Resp 16  Ht 6\' 1"  (1.854 m)    Wt 85 kg    SpO2 100%    BMI 24.74 kg/m   Physical Exam Vitals and nursing note reviewed.  Constitutional:      Appearance: Normal appearance.  HENT:     Head: Normocephalic and atraumatic.     Nose: Nose normal.     Mouth/Throat:     Mouth: Mucous membranes are moist.  Eyes:     Extraocular Movements: Extraocular movements intact.     Conjunctiva/sclera: Conjunctivae normal.  Cardiovascular:     Rate and Rhythm: Normal rate.  Pulmonary:     Effort: Pulmonary effort is normal.     Breath sounds: Normal breath sounds.  Abdominal:     General: Abdomen is flat.     Palpations: Abdomen is soft.     Tenderness: There is no abdominal tenderness.  Musculoskeletal:        General: No swelling. Normal range of motion.     Cervical back: Neck supple.  Skin:    General:  Skin is warm and dry.  Neurological:     General: No focal deficit present.     Mental Status: He is alert.  Psychiatric:        Mood and Affect: Mood normal.      ED Results / Procedures / Treatments   Labs (all labs ordered are listed, but only abnormal results are displayed) Labs Reviewed - No data to display  EKG EKG Interpretation  Date/Time:  Monday September 17 2020 17:50:47 EST Ventricular Rate:  75 PR Interval:  138 QRS Duration: 92 QT Interval:  378 QTC Calculation: 422 R Axis:   71 Text Interpretation: Normal sinus rhythm Normal ECG Since last tracing Rate slower Confirmed by 10-10-1999 [id number]) on 09/17/2020 5:58:40 PM    Radiology DG Chest 2 View  Result Date: 09/17/2020 CLINICAL DATA:  Chest pain EXAM: CHEST - 2 VIEW COMPARISON:  11/24/2017 FINDINGS: The heart size and mediastinal contours are within normal limits. Both lungs are clear. The visualized skeletal structures are unremarkable. Hardware in the left clavicle. IMPRESSION: No active cardiopulmonary disease. Electronically Signed   By: 11/26/2017 M.D.   On: 09/17/2020 19:16    Procedures Procedures  Medications Ordered in the ED Medications - No data to display   MDM Rules/Calculators/A&P MDM Patient with atypical chest pain, ongoing for about 2 months. EKG is normal. Labs from PCP reviewed and normal. Doubt ACS/CAD so no utility in Troponin at this time. Has not had a CXR yet, so will complete that while he is here. He is requesting a referral to a Pulmonologist.   ED Course  I have reviewed the triage vital signs and the nursing notes.  Pertinent labs & imaging results that were available during my care of the patient were reviewed by me and considered in my medical decision making (see chart for details).  Clinical Course as of 09/17/20 2119  Mon Sep 17, 2020  1936 Per Xray tech, patient cursing and upset that I was asking him questions such as why he waited so long to come to the ED  and what may have changed today to prompt his visit. He apparently asked her if he could see a different provider. I went to the room to give the patient his results and he became very confrontational, asking why I was in his room again and he began recording me on his phone without my consent. I was informed that recording was prohibited  in the ED but he continued recording me anyway. I explained that seeing another provider was not feasible at this time. He was informed he does not have an emergent medical condition, no further ED workup was indicated and that he should follow up with his PCP. He was advised the is welcome to return to the ED at any point for reassessment.  [CS]    Clinical Course User Index [CS] Pollyann Savoy, MD    Final Clinical Impression(s) / ED Diagnoses Final diagnoses:  Atypical chest pain    Rx / DC Orders ED Discharge Orders    None       Pollyann Savoy, MD 09/17/20 2120

## 2020-09-17 NOTE — ED Triage Notes (Signed)
Chest pain on and off x over 2 months. Dull pain with fatigue and sob at onset.

## 2020-10-03 DIAGNOSIS — Z03818 Encounter for observation for suspected exposure to other biological agents ruled out: Secondary | ICD-10-CM | POA: Diagnosis not present

## 2020-10-03 DIAGNOSIS — Z20822 Contact with and (suspected) exposure to covid-19: Secondary | ICD-10-CM | POA: Diagnosis not present

## 2020-10-26 DIAGNOSIS — R079 Chest pain, unspecified: Secondary | ICD-10-CM | POA: Diagnosis not present

## 2020-11-01 DIAGNOSIS — F410 Panic disorder [episodic paroxysmal anxiety] without agoraphobia: Secondary | ICD-10-CM | POA: Diagnosis not present

## 2020-11-01 DIAGNOSIS — R079 Chest pain, unspecified: Secondary | ICD-10-CM | POA: Diagnosis not present

## 2020-11-02 DIAGNOSIS — F900 Attention-deficit hyperactivity disorder, predominantly inattentive type: Secondary | ICD-10-CM | POA: Diagnosis not present

## 2020-11-02 DIAGNOSIS — F4010 Social phobia, unspecified: Secondary | ICD-10-CM | POA: Diagnosis not present

## 2020-11-02 DIAGNOSIS — F422 Mixed obsessional thoughts and acts: Secondary | ICD-10-CM | POA: Diagnosis not present

## 2020-11-23 DIAGNOSIS — F410 Panic disorder [episodic paroxysmal anxiety] without agoraphobia: Secondary | ICD-10-CM | POA: Diagnosis not present

## 2020-11-30 DIAGNOSIS — E291 Testicular hypofunction: Secondary | ICD-10-CM | POA: Diagnosis not present

## 2020-12-14 DIAGNOSIS — E291 Testicular hypofunction: Secondary | ICD-10-CM | POA: Diagnosis not present

## 2021-03-08 DIAGNOSIS — F422 Mixed obsessional thoughts and acts: Secondary | ICD-10-CM | POA: Diagnosis not present

## 2021-03-08 DIAGNOSIS — F4010 Social phobia, unspecified: Secondary | ICD-10-CM | POA: Diagnosis not present

## 2021-03-08 DIAGNOSIS — F900 Attention-deficit hyperactivity disorder, predominantly inattentive type: Secondary | ICD-10-CM | POA: Diagnosis not present

## 2023-09-09 ENCOUNTER — Other Ambulatory Visit: Payer: Self-pay

## 2023-09-09 DIAGNOSIS — R202 Paresthesia of skin: Secondary | ICD-10-CM

## 2023-09-14 ENCOUNTER — Ambulatory Visit: Payer: 59 | Admitting: Neurology

## 2023-09-14 DIAGNOSIS — R202 Paresthesia of skin: Secondary | ICD-10-CM

## 2023-09-14 DIAGNOSIS — G5603 Carpal tunnel syndrome, bilateral upper limbs: Secondary | ICD-10-CM

## 2023-09-14 NOTE — Procedures (Signed)
[facility] Neurology  [address] [place], [address]  [place], [place] [address] Tel: [phone] Fax: [phone] Test Date:  09/14/2023  Patient: Isaiah Stevens DOB: Feb 27, 1991 Physician: Jacquelyne Balint, MD  Sex: Male Height: 6\' 1"  Ref Phys: Teryl Lucy, MD  ID#: [id number]   Technician:    History: This is a 32 year old male with right wrist pain, numbness, and tingling.  NCV & EMG Findings: Extensive electrodiagnostic evaluation of the right upper limb with additional nerve conduction studies of the left upper limb shows: Bilateral median-ulnar palmar sensory responses show abnormal peak latency difference ((Median Palm-Wrist)-(Ulnar Palm-Wrist), L0.45 ms, R0.62 ms). Right median, ulnar, and radial sensory responses are within normal limits. Bilateral median (APB) and right ulnar (ADM) motor responses are within normal limits. There is no evidence of active or chronic motor axon loss changes affecting any of the tested muscles on needle examination. Motor unit configuration and recruitment pattern is within normal limits.  Impression: This is an abnormal study. The findings are most consistent with the following: Evidence of bilateral median mononeuropathies at or distal to the wrist, consistent with carpal tunnel syndrome, very mild in degree electrically bilaterally, right worse than left. No electrodiagnostic evidence of a right cervical (C5-C8) motor radiculopathy. Screening studies for right ulnar or radial mononeuropathies are normal.    ___________________________ Jacquelyne Balint, MD    Nerve Conduction Studies Motor Nerve Results    Latency Amplitude F-Lat Segment Distance CV Comment  Site (ms) Norm (mV) Norm (ms)  (cm) (m/s) Norm   Left Median (APB) Motor  Wrist 3.2  < 3.9 8.2  > 6.0        Right Median (APB) Motor  Wrist 3.2  < 3.9 9.0  > 6.0        Elbow 8.6 - 8.6 -  Elbow-Wrist 32 59  > 50   Right Ulnar (ADM) Motor  Wrist 1.70  < 3.1 12.8  > 7.0        Bel  elbow 5.7 - 12.0 -  Bel elbow-Wrist 24.5 61  > 50   Ab elbow 7.5 - 11.6 -  Ab elbow-Bel elbow 10 56 -    Sensory Sites    Neg Peak Lat Amplitude (O-P) Segment Distance Velocity Comment  Site (ms) Norm (V) Norm  (cm) (ms)   Right Median Sensory  Wrist-Dig II 3.2  < 3.4 34  > 20 Wrist-Dig II 13    Left Median-Ulnar Palmar Sensory       Median  Palm-Wrist 2.1  < 2.2 174  > 10 Palm-Wrist 8         Ulnar  Palm-Wrist 1.65  < 2.2 57  > 5 Palm-Wrist 8    Right Median-Ulnar Palmar Sensory       Median  Palm-Wrist 2.2  < 2.2 103  > 10 Palm-Wrist 8         Ulnar  Palm-Wrist 1.58  < 2.2 33  > 5 Palm-Wrist 8    Right Radial Sensory  Forearm-Wrist 2.1  < 2.7 38  > 18 Forearm-Wrist 10    Right Ulnar Sensory  Wrist-Dig V 2.5  < 3.1 27  > 12 Wrist-Dig V 11     Inter-Nerve Comparisons   Nerve 1 Value 1 Nerve 2 Value 2 Parameter Result Normal  Sensory Sites  R Median Palm-Wrist 2.2 ms R Ulnar Palm-Wrist 1.58 ms Peak Lat Diff *0.62 ms <0.40  L Median Palm-Wrist 2.1 ms L Ulnar Palm-Wrist 1.65 ms Peak Lat  Diff *0.45 ms <0.40   Electromyography   Side Muscle Ins.Act Fibs Fasc Recrt Amp Dur Poly Activation Comment  Right FDI Nml Nml Nml Nml Nml Nml Nml Nml N/A  Right EIP Nml Nml Nml Nml Nml Nml Nml Nml N/A  Right Pronator teres Nml Nml Nml Nml Nml Nml Nml Nml N/A  Right Biceps Nml Nml Nml Nml Nml Nml Nml Nml N/A  Right Triceps Nml Nml Nml Nml Nml Nml Nml Nml N/A  Right Deltoid Nml Nml Nml Nml Nml Nml Nml Nml N/A      Waveforms:  Motor        Sensory
